# Patient Record
Sex: Female | Born: 1970 | Race: White | Hispanic: No | Marital: Single
Health system: Midwestern US, Community
[De-identification: ages and names within clinical notes are randomized; demographics above are authoritative.]

## PROBLEM LIST (undated history)

## (undated) DIAGNOSIS — M25561 Pain in right knee: Secondary | ICD-10-CM

## (undated) DIAGNOSIS — E079 Disorder of thyroid, unspecified: Secondary | ICD-10-CM

## (undated) DIAGNOSIS — J45909 Unspecified asthma, uncomplicated: Secondary | ICD-10-CM

## (undated) DIAGNOSIS — G8929 Other chronic pain: Secondary | ICD-10-CM

## (undated) DIAGNOSIS — I10 Essential (primary) hypertension: Secondary | ICD-10-CM

## (undated) DIAGNOSIS — F411 Generalized anxiety disorder: Secondary | ICD-10-CM

## (undated) DIAGNOSIS — E038 Other specified hypothyroidism: Secondary | ICD-10-CM

## (undated) DIAGNOSIS — K219 Gastro-esophageal reflux disease without esophagitis: Secondary | ICD-10-CM

## (undated) DIAGNOSIS — M1990 Unspecified osteoarthritis, unspecified site: Secondary | ICD-10-CM

## (undated) DIAGNOSIS — D649 Anemia, unspecified: Secondary | ICD-10-CM

## (undated) DIAGNOSIS — F419 Anxiety disorder, unspecified: Secondary | ICD-10-CM

## (undated) HISTORY — DX: Gastro-esophageal reflux disease without esophagitis: K21.9

## (undated) HISTORY — DX: Anemia, unspecified: D64.9

## (undated) HISTORY — DX: Unspecified asthma, uncomplicated: J45.909

## (undated) HISTORY — PX: TONSILLECTOMY: SUR1361

## (undated) HISTORY — DX: Unspecified osteoarthritis, unspecified site: M19.90

## (undated) HISTORY — PX: HERNIA REPAIR: SHX51

## (undated) HISTORY — DX: Essential (primary) hypertension: I10

## (undated) HISTORY — DX: Disorder of thyroid, unspecified: E07.9

## (undated) HISTORY — DX: Anxiety disorder, unspecified: F41.9

---

## 2004-06-30 HISTORY — PX: BREAST SURGERY: SHX581

## 2017-11-26 DIAGNOSIS — Z01419 Encounter for gynecological examination (general) (routine) without abnormal findings: Secondary | ICD-10-CM | POA: Diagnosis not present

## 2017-11-26 DIAGNOSIS — Z124 Encounter for screening for malignant neoplasm of cervix: Secondary | ICD-10-CM | POA: Diagnosis not present

## 2017-12-02 DIAGNOSIS — J069 Acute upper respiratory infection, unspecified: Secondary | ICD-10-CM | POA: Diagnosis not present

## 2017-12-02 DIAGNOSIS — J028 Acute pharyngitis due to other specified organisms: Secondary | ICD-10-CM | POA: Diagnosis not present

## 2017-12-07 DIAGNOSIS — J069 Acute upper respiratory infection, unspecified: Secondary | ICD-10-CM | POA: Diagnosis not present

## 2017-12-07 DIAGNOSIS — Z1231 Encounter for screening mammogram for malignant neoplasm of breast: Secondary | ICD-10-CM | POA: Diagnosis not present

## 2017-12-16 DIAGNOSIS — E039 Hypothyroidism, unspecified: Secondary | ICD-10-CM | POA: Diagnosis not present

## 2017-12-16 DIAGNOSIS — E559 Vitamin D deficiency, unspecified: Secondary | ICD-10-CM | POA: Diagnosis not present

## 2018-03-05 DIAGNOSIS — H524 Presbyopia: Secondary | ICD-10-CM | POA: Diagnosis not present

## 2018-04-05 DIAGNOSIS — J069 Acute upper respiratory infection, unspecified: Secondary | ICD-10-CM | POA: Diagnosis not present

## 2018-04-15 DIAGNOSIS — R05 Cough: Secondary | ICD-10-CM | POA: Diagnosis not present

## 2018-04-15 DIAGNOSIS — J209 Acute bronchitis, unspecified: Secondary | ICD-10-CM | POA: Diagnosis not present

## 2018-04-22 DIAGNOSIS — J209 Acute bronchitis, unspecified: Secondary | ICD-10-CM | POA: Diagnosis not present

## 2018-05-14 DIAGNOSIS — J209 Acute bronchitis, unspecified: Secondary | ICD-10-CM | POA: Diagnosis not present

## 2018-05-19 DIAGNOSIS — J410 Simple chronic bronchitis: Secondary | ICD-10-CM | POA: Diagnosis not present

## 2018-08-19 NOTE — Telephone Encounter (Signed)
Requested Prescriptions     Pending Prescriptions Disp Refills   ??? losartan (COZAAR) 50 mg tablet 90 Tab 0     Sig: Take 1 Tab by mouth daily.

## 2018-08-20 MED ORDER — LOSARTAN 50 MG TAB
50 mg | ORAL_TABLET | Freq: Every day | ORAL | 0 refills | Status: DC
Start: 2018-08-20 — End: 2018-11-10

## 2018-09-09 MED ORDER — MONTELUKAST 10 MG TAB
10 mg | ORAL_TABLET | ORAL | 0 refills | Status: DC
Start: 2018-09-09 — End: 2018-12-02

## 2018-10-25 ENCOUNTER — Telehealth
Admit: 2018-10-25 | Discharge: 2018-10-25 | Payer: PRIVATE HEALTH INSURANCE | Attending: Internal Medicine | Primary: Internal Medicine

## 2018-10-25 ENCOUNTER — Telehealth: Attending: Internal Medicine | Primary: Internal Medicine

## 2018-10-25 DIAGNOSIS — I10 Essential (primary) hypertension: Secondary | ICD-10-CM

## 2018-10-25 DIAGNOSIS — Z9114 Patient's other noncompliance with medication regimen: Secondary | ICD-10-CM | POA: Diagnosis not present

## 2018-10-25 DIAGNOSIS — J301 Allergic rhinitis due to pollen: Secondary | ICD-10-CM | POA: Diagnosis not present

## 2018-10-25 DIAGNOSIS — R69 Illness, unspecified: Secondary | ICD-10-CM | POA: Diagnosis not present

## 2018-10-25 MED ORDER — ALPRAZOLAM 0.25 MG TAB
0.25 mg | ORAL_TABLET | Freq: Every day | ORAL | 0 refills | Status: AC | PRN
Start: 2018-10-25 — End: 2018-11-24

## 2018-10-25 MED ORDER — SERTRALINE 25 MG TAB
25 mg | ORAL_TABLET | Freq: Every day | ORAL | 0 refills | Status: DC
Start: 2018-10-25 — End: 2019-01-20

## 2018-10-25 NOTE — Progress Notes (Signed)
Progress Note    Patient: Victoria CrawLisa Carmen MRN: [id number]     Date of Birth: Mar 12, 1971  Age: 48 y.o.  Sex: female        Reason for Visit: No chief complaint on file.  The patient has given consent to a virtual visit and understands:    ??? The patient has the right to refuse a virtual visit and have a face-to-face visit.  ??? The patient has the right to know the professional credentials of the person who will be speaking to them.  ??? The patient has the right to know the location of the provider and what technology they will be using.  ??? The patient has the right to have appropriate staff available to them immediately while receiving telehealth services for emergencies or immediate needs.  ??? The patient has the right to know the identities of all persons on the call.  ??? The patient has the right to ask for a different provider and that, if they request a different provider, there may be a delay or a face-to-face visit may be needed.  ??? Telehealth calls cannot be recorded without the patient's consent.  ??? Translation services must be provided if needed.  ??? The patient must be informed that coinsurances and deductibles will be applied by Medicare and they are responsible for these balances.  Patient was read the rights and verbal consent was received  The provider Quentin AngstStacie Patience Nuzzo, MD is located at the office ([address] [place], [place] [id number]) and the patient, Victoria Campbell is located in their car    Subjective:   This is a 48 year old female with past medical history significant for anxiety and hypertension.  The patient stated that she is considering a central plate but her office is currently closed.  She does anticipate that she will be going back to work shortly.  She stated with the coronavirus crisis that she has been under increasing amounts of stress and anxiety.  The patient stated that she has not been taking the Zoloft every day as she is worried about taking too much.  Patient does state  that her anxiety has been worse recently and she like to go back on the medication every day.  She is also requesting a refill of the alprazolam.    Pre-existing problem list  Patient Active Problem List   Diagnosis Code   ??? Generalized anxiety disorder F41.1   ??? Subclinical hypothyroidism E03.9   ??? Essential hypertension I10   ??? Seasonal allergic rhinitis due to pollen J30.1     Allergies   Allergen Reactions   ??? Codeine Itching   ??? Erythromycin Base Diarrhea   ??? Penicillins Itching     Current Outpatient Medications   Medication Sig Dispense Refill   ??? levothyroxine (SYNTHROID) 25 mcg tablet Take  by mouth.     ??? sertraline (ZOLOFT) 25 mg tablet Take 1 Tab by mouth daily for 90 days. 90 Tab 0   ??? ALPRAZolam (XANAX) 0.25 mg tablet Take 1 Tab by mouth daily as needed for Anxiety for up to 30 days. 30 Tab 0   ??? montelukast (SINGULAIR) 10 mg tablet TAKE 1 TABLET BY MOUTH EVERY DAY 90 Tab 0   ??? losartan (COZAAR) 50 mg tablet Take 1 Tab by mouth daily. 90 Tab 0     Objective:     There were no vitals filed for this visit.     Review of Systems   Constitutional: Negative for chills  and fever.   HENT: Negative for congestion.    Respiratory: Negative for cough and shortness of breath.    Cardiovascular: Negative for chest pain, palpitations, orthopnea and claudication.   Gastrointestinal: Negative for diarrhea, nausea and vomiting.   Genitourinary: Negative for dysuria, frequency and urgency.   Musculoskeletal: Negative for back pain.   Neurological: Negative for headaches.   Psychiatric/Behavioral: The patient is nervous/anxious.      Physical Exam:   Virtual Visit  Patient alert, Awake and oriented x 3  No acute distress  Asking and answering questions appropriately  Normal affect    Assessment:     1. Essential hypertension  Keep to low sodium diet and increase exercise and work on weight loss. The patient stated that she has enough losartan    2. Generalized anxiety disorder   Recommended daily exercise to help alleviate some of her anxiety. Also recommended that she take her medications daily as instructed  - sertraline (ZOLOFT) 25 mg tablet; Take 1 Tab by mouth daily for 90 days.  Dispense: 90 Tab; Refill: 0  - ALPRAZolam (XANAX) 0.25 mg tablet; Take 1 Tab by mouth daily as needed for Anxiety for up to 30 days.  Dispense: 30 Tab; Refill: 0    3. Seasonal allergic rhinitis due to pollen  OTC anti-histamines as needed     4. Noncompliance with medication treatment due to underuse of medication  Discussed with the patient in length that she should have run out of her zoloft in 07/2018 if she was taking the medication daily. Patient voiced understanding and stated that she plans to make sure she takes it everyday. I explained to the patient that the benefits will not be there if the medication is not taken daily. The patient voiced understanding.      Follow-up and Dispositions    ?? Return if symptoms worsen or fail to improve.         Signed By: Quentin Angst, MD     October 25, 2018

## 2018-10-25 NOTE — Progress Notes (Signed)
Progress Note    Patient: Victoria CrawLisa Campbell MRN: [id number]     Date of Birth: September 08, 1970  Age: 48 y.o.  Sex: female        Reason for Visit: No chief complaint on file.  The patient has given consent to a virtual visit and understands:    ??? The patient has the right to refuse a virtual visit and have a face-to-face visit.  ??? The patient has the right to know the professional credentials of the person who will be speaking to them.  ??? The patient has the right to know the location of the provider and what technology they will be using.  ??? The patient has the right to have appropriate staff available to them immediately while receiving telehealth services for emergencies or immediate needs.  ??? The patient has the right to know the identities of all persons on the call.  ??? The patient has the right to ask for a different provider and that, if they request a different provider, there may be a delay or a face-to-face visit may be needed.  ??? Telehealth calls cannot be recorded without the patient's consent.  ??? Translation services must be provided if needed.  ??? The patient must be informed that coinsurances and deductibles will be applied by Medicare and they are responsible for these balances.  Patient was read the rights and verbal consent was received  The provider Quentin AngstStacie Morayma Godown, MD is located at the office ([address] [place], [place] [id number]) and the patient, Victoria Campbell is located in their car    Subjective:   This is a 48 year old female with past medical history significant for anxiety and hypertension.  The patient stated that she is considering a central plate but her office is currently closed.  She does anticipate that she will be going back to work shortly.  She stated with the coronavirus crisis that she has been under increasing amounts of stress and anxiety.  The patient stated that she has not been taking the Zoloft every day as she is worried about taking too much.  Patient does state that her anxiety has  been worse recently and she like to go back on the medication every day.  She is also requesting a refill of the alprazolam.    Pre-existing problem list  Patient Active Problem List   Diagnosis Code   ??? Generalized anxiety disorder F41.1   ??? Subclinical hypothyroidism E03.9   ??? Essential hypertension I10   ??? Seasonal allergic rhinitis due to pollen J30.1     Allergies   Allergen Reactions   ??? Codeine Itching   ??? Erythromycin Base Diarrhea   ??? Penicillins Itching     Current Outpatient Medications   Medication Sig Dispense Refill   ??? levothyroxine (SYNTHROID) 25 mcg tablet Take  by mouth.     ??? sertraline (ZOLOFT) 25 mg tablet Take 1 Tab by mouth daily for 90 days. 90 Tab 0   ??? ALPRAZolam (XANAX) 0.25 mg tablet Take 1 Tab by mouth daily as needed for Anxiety for up to 30 days. 30 Tab 0   ??? montelukast (SINGULAIR) 10 mg tablet TAKE 1 TABLET BY MOUTH EVERY DAY 90 Tab 0   ??? losartan (COZAAR) 50 mg tablet Take 1 Tab by mouth daily. 90 Tab 0     Objective:     There were no vitals filed for this visit.     Review of Systems   Constitutional: Negative for chills  and fever.   HENT: Negative for congestion.    Respiratory: Negative for cough and shortness of breath.    Cardiovascular: Negative for chest pain, palpitations, orthopnea and claudication.   Gastrointestinal: Negative for diarrhea, nausea and vomiting.   Genitourinary: Negative for dysuria, frequency and urgency.   Musculoskeletal: Negative for back pain.   Neurological: Negative for headaches.   Psychiatric/Behavioral: The patient is nervous/anxious.      Physical Exam:   Virtual Visit  Patient alert, Awake and oriented x 3  No acute distress  Asking and answering questions appropriately  Normal affect    Assessment:     1. Essential hypertension  Keep to low sodium diet and increase exercise and work on weight loss. The patient stated that she has enough losartan    2. Generalized anxiety disorder  Recommended daily exercise to help alleviate some of her  anxiety. Also recommended that she take her medications daily as instructed  - sertraline (ZOLOFT) 25 mg tablet; Take 1 Tab by mouth daily for 90 days.  Dispense: 90 Tab; Refill: 0  - ALPRAZolam (XANAX) 0.25 mg tablet; Take 1 Tab by mouth daily as needed for Anxiety for up to 30 days.  Dispense: 30 Tab; Refill: 0    3. Seasonal allergic rhinitis due to pollen  OTC anti-histamines as needed     4. Noncompliance with medication treatment due to underuse of medication  Discussed with the patient in length that she should have run out of her zoloft in 07/2018 if she was taking the medication daily. Patient voiced understanding and stated that she plans to make sure she takes it everyday. I explained to the patient that the benefits will not be there if the medication is not taken daily. The patient voiced understanding.      Follow-up and Dispositions    ?? Return if symptoms worsen or fail to improve.         Signed By: Quentin Angst, MD     October 25, 2018

## 2018-11-10 MED ORDER — LOSARTAN 50 MG TAB
50 mg | ORAL_TABLET | ORAL | 0 refills | Status: DC
Start: 2018-11-10 — End: 2019-02-07

## 2018-12-02 MED ORDER — MONTELUKAST 10 MG TAB
10 mg | ORAL_TABLET | ORAL | 0 refills | Status: DC
Start: 2018-12-02 — End: 2019-03-01

## 2019-01-20 ENCOUNTER — Encounter

## 2019-01-20 MED ORDER — SERTRALINE 25 MG TAB
25 mg | ORAL_TABLET | ORAL | 0 refills | Status: DC
Start: 2019-01-20 — End: 2019-04-17

## 2019-02-07 MED ORDER — LOSARTAN 50 MG TAB
50 mg | ORAL_TABLET | ORAL | 0 refills | Status: DC
Start: 2019-02-07 — End: 2019-05-09

## 2019-02-07 NOTE — Telephone Encounter (Signed)
Refill completed.  Please remind the patient that she is due for office visit and fasting labs prior to next refill.

## 2019-02-16 NOTE — Telephone Encounter (Signed)
Left  Message  For  Pt  To  Call   back

## 2019-03-01 MED ORDER — MONTELUKAST 10 MG TAB
10 mg | ORAL_TABLET | ORAL | 0 refills | Status: DC
Start: 2019-03-01 — End: 2019-06-21

## 2019-03-13 DIAGNOSIS — J00 Acute nasopharyngitis [common cold]: Secondary | ICD-10-CM | POA: Diagnosis not present

## 2019-03-16 ENCOUNTER — Telehealth
Admit: 2019-03-16 | Discharge: 2019-03-16 | Payer: PRIVATE HEALTH INSURANCE | Attending: Internal Medicine | Primary: Internal Medicine

## 2019-03-16 ENCOUNTER — Inpatient Hospital Stay: Admit: 2019-03-16 | Payer: PRIVATE HEALTH INSURANCE | Primary: Internal Medicine

## 2019-03-16 ENCOUNTER — Telehealth: Attending: Internal Medicine | Primary: Internal Medicine

## 2019-03-16 DIAGNOSIS — J0100 Acute maxillary sinusitis, unspecified: Secondary | ICD-10-CM

## 2019-03-16 DIAGNOSIS — Z1159 Encounter for screening for other viral diseases: Secondary | ICD-10-CM | POA: Diagnosis not present

## 2019-03-16 DIAGNOSIS — I10 Essential (primary) hypertension: Secondary | ICD-10-CM | POA: Diagnosis not present

## 2019-03-16 DIAGNOSIS — Z20828 Contact with and (suspected) exposure to other viral communicable diseases: Secondary | ICD-10-CM | POA: Diagnosis not present

## 2019-03-16 MED ORDER — AZITHROMYCIN 250 MG TAB
250 mg | ORAL_TABLET | ORAL | 0 refills | Status: AC
Start: 2019-03-16 — End: 2019-03-21

## 2019-03-16 NOTE — Progress Notes (Signed)
Progress Note    Patient: Victoria Campbell MRN: [id number]     Date of Birth: 1970-08-01  Age: 48 y.o.  Sex: female      Reason for Visit: No chief complaint on file.  The patient has given consent to a virtual visit and understands:    ??? The patient has the right to refuse a virtual visit and have a face-to-face visit.  ??? The patient has the right to know the professional credentials of the person who will be speaking to them.  ??? The patient has the right to know the location of the provider and what technology they will be using.  ??? The patient has the right to have appropriate staff available to them immediately while receiving telehealth services for emergencies or immediate needs.  ??? The patient has the right to know the identities of all persons on the call.  ??? The patient has the right to ask for a different provider and that, if they request a different provider, there may be a delay or a face-to-face visit may be needed.  ??? Telehealth calls cannot be recorded without the patient's consent.  ??? Translation services must be provided if needed.  ??? The patient must be informed that coinsurances and deductibles will be applied by Medicare and they are responsible for these balances.  Patient was read the rights and verbal consent was received  The provider Lyanne Co, MD is located at the office ([address] [place] [address]) and the patient, Victoria Campbell is located in the car    Subjective:   This is a pleasant 48 year old female with past medical history significant for anxiety hypothyroidism and hypertension.  The patient stated on Saturday she started not feeling well with cough congestion and facial pressure.  The patient stated that she assumed it was a sinus infection.  She went to urgent care in [place] on Sunday for evaluation although was not tested for COVID.  She was recommended to use over-the-counter antihistamines and nasal spray.  The patient continues to  not feel well and scheduled appointment today for evaluation.  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 Generalized anxiety disorder F41.1   ??? Subclinical hypothyroidism E03.9   ??? Essential hypertension I10   ??? Seasonal allergic rhinitis due to pollen J30.1       Allergies   Allergen Reactions   ??? Codeine Itching   ??? Erythromycin Base Diarrhea   ??? Penicillins Itching       Current Outpatient Medications   Medication Sig Dispense Refill   ??? azithromycin (Zithromax Z-Pak) 250 mg tablet Take one pack as instructed for 5 days 6 Tab 0   ??? montelukast (SINGULAIR) 10 mg tablet TAKE 1 TABLET BY MOUTH EVERY DAY 90 Tab 0   ??? losartan (COZAAR) 50 mg tablet TAKE 1 TABLET BY MOUTH EVERY DAY 90 Tab 0   ??? sertraline (ZOLOFT) 25 mg tablet TAKE 1 TABLET BY MOUTH EVERY DAY 90 Tab 0   ??? levothyroxine (SYNTHROID) 25 mcg tablet Take  by mouth.         Social History     Tobacco Use   ??? Smoking status: Not on file   Substance Use Topics   ??? Alcohol use: Not on file       Objective:  No Acute Distress/Alert, Awake, Oriented x 3  There were no vitals filed for this visit.     Review of Systems   Constitutional: Negative for chills, fever and malaise/fatigue.   HENT: Positive for congestion, sinus pain and sore throat. Negative for tinnitus.    Eyes: Negative for photophobia.   Respiratory: Positive for cough. Negative for shortness of breath and wheezing.    Cardiovascular: Negative for chest pain, palpitations, orthopnea, claudication and leg swelling.   Gastrointestinal: Negative for diarrhea, nausea and vomiting.   Genitourinary: Negative for dysuria, frequency, hematuria and urgency.   Musculoskeletal: Negative for back pain.   Neurological: Negative for tingling, sensory change, weakness and headaches.   Psychiatric/Behavioral: Negative for depression. The patient is not  nervous/anxious.      Physical Exam:   Virtual visit  Patient is alert awake oriented x3  No acute distress  Asking and answering questions appropriately  No cough or use of accessory muscles during the interview  Able to complete full sentences without stopping  Normal affect    Assessment:   1. Acute non-recurrent maxillary sinusitis  I counseled the patient on supportive care and I recommended that she start a Z-Pak empirically.  I also advised her that she should remain out of work until she gets her COVID-19 test back in quarantine.  The patient advised if any shortness of breath develop she is to go to emergency department immediately  - azithromycin (Zithromax Z-Pak) 250 mg tablet; Take one pack as instructed for 5 days  Dispense: 6 Tab; Refill: 0  - NOVEL CORONAVIRUS (COVID-19); Future    2. Exposure to COVID-19 virus  Counseled patient on the importance of good handwashing.  Also recommended disinfecting countertops and doorknobs regularly. The patient advised that the best thing to do is to stay at home.  Also educated the patient that despite being asymptomatic the virus can shed to others and cause an acute illness. The patient voiced understanding.  - NOVEL CORONAVIRUS (COVID-19); Future    3. Essential hypertension  The patient currently started taking Mucinex D.  I recommended that she avoid all phenylephrine/"D" products as they will elevate her blood pressure even more.    Follow-up and Dispositions    ?? Return if symptoms worsen or fail to improve.         Signed By: Quentin AngstStacie Quantavius Humm, MD     March 16, 2019      Note: This report was transcribed using voice recognition software and is thus   prone to syntax and contextual spelling errors. Please do not hesitate to call   me if anything needs clarification or a dictated addendum.

## 2019-03-16 NOTE — Progress Notes (Signed)
Progress Note    Patient: Victoria CrawLisa Mele MRN: [id number]     Date of Birth: [birth year]  Age: 48 y.o.  Sex: female      Reason for Visit: No chief complaint on file.  The patient has given consent to a virtual visit and understands:    ??? The patient has the right to refuse a virtual visit and have a face-to-face visit.  ??? The patient has the right to know the professional credentials of the person who will be speaking to them.  ??? The patient has the right to know the location of the provider and what technology they will be using.  ??? The patient has the right to have appropriate staff available to them immediately while receiving telehealth services for emergencies or immediate needs.  ??? The patient has the right to know the identities of all persons on the call.  ??? The patient has the right to ask for a different provider and that, if they request a different provider, there may be a delay or a face-to-face visit may be needed.  ??? Telehealth calls cannot be recorded without the patient's consent.  ??? Translation services must be provided if needed.  ??? The patient must be informed that coinsurances and deductibles will be applied by Medicare and they are responsible for these balances.  Patient was read the rights and verbal consent was received  The provider Quentin AngstStacie Vernon Ariel, MD is located at the office ([address] [place], [place] [id number]) and the patient, Victoria Campbell is located in the car    Subjective:   This is a pleasant 48 year old female with past medical history significant for anxiety hypothyroidism and hypertension.  The patient stated on Saturday she started not feeling well with cough congestion and facial pressure.  The patient stated that she assumed it was a sinus infection.  She went to urgent care in [place] on Sunday for evaluation although was not tested for COVID.  She was recommended to use over-the-counter antihistamines and nasal spray.  The patient continues to not feel well and scheduled  appointment today for evaluation.  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 Generalized anxiety disorder F41.1   ??? Subclinical hypothyroidism E03.9   ??? Essential hypertension I10   ??? Seasonal allergic rhinitis due to pollen J30.1       Allergies   Allergen Reactions   ??? Codeine Itching   ??? Erythromycin Base Diarrhea   ??? Penicillins Itching       Current Outpatient Medications   Medication Sig Dispense Refill   ??? azithromycin (Zithromax Z-Pak) 250 mg tablet Take one pack as instructed for 5 days 6 Tab 0   ??? montelukast (SINGULAIR) 10 mg tablet TAKE 1 TABLET BY MOUTH EVERY DAY 90 Tab 0   ??? losartan (COZAAR) 50 mg tablet TAKE 1 TABLET BY MOUTH EVERY DAY 90 Tab 0   ??? sertraline (ZOLOFT) 25 mg tablet TAKE 1 TABLET BY MOUTH EVERY DAY 90 Tab 0   ??? levothyroxine (SYNTHROID) 25 mcg tablet Take  by mouth.         Social History     Tobacco Use   ??? Smoking status: Not on file   Substance Use Topics   ??? Alcohol use: Not on file       Objective:  No Acute Distress/Alert, Awake, Oriented x 3  There were no vitals filed for this visit.     Review of Systems   Constitutional: Negative for chills, fever and malaise/fatigue.   HENT: Positive for congestion, sinus pain and sore throat. Negative for tinnitus.    Eyes: Negative for photophobia.   Respiratory: Positive for cough. Negative for shortness of breath and wheezing.    Cardiovascular: Negative for chest pain, palpitations, orthopnea, claudication and leg swelling.   Gastrointestinal: Negative for diarrhea, nausea and vomiting.   Genitourinary: Negative for dysuria, frequency, hematuria and urgency.   Musculoskeletal: Negative for back pain.   Neurological: Negative for tingling, sensory change, weakness and headaches.   Psychiatric/Behavioral: Negative for depression. The patient is not nervous/anxious.      Physical Exam:    Virtual visit  Patient is alert awake oriented x3  No acute distress  Asking and answering questions appropriately  No cough or use of accessory muscles during the interview  Able to complete full sentences without stopping  Normal affect    Assessment:   1. Acute non-recurrent maxillary sinusitis  I counseled the patient on supportive care and I recommended that she start a Z-Pak empirically.  I also advised her that she should remain out of work until she gets her COVID-19 test back in quarantine.  The patient advised if any shortness of breath develop she is to go to emergency department immediately  - azithromycin (Zithromax Z-Pak) 250 mg tablet; Take one pack as instructed for 5 days  Dispense: 6 Tab; Refill: 0  - NOVEL CORONAVIRUS (COVID-19); Future    2. Exposure to COVID-19 virus  Counseled patient on the importance of good handwashing.  Also recommended disinfecting countertops and doorknobs regularly. The patient advised that the best thing to do is to stay at home.  Also educated the patient that despite being asymptomatic the virus can shed to others and cause an acute illness. The patient voiced understanding.  - NOVEL CORONAVIRUS (COVID-19); Future    3. Essential hypertension  The patient currently started taking Mucinex D.  I recommended that she avoid all phenylephrine/"D" products as they will elevate her blood pressure even more.    Follow-up and Dispositions    ?? Return if symptoms worsen or fail to improve.         Signed By: Lyanne Co, MD     March 16, 2019      Note: This report was transcribed using voice recognition software and is thus   prone to syntax and contextual spelling errors. Please do not hesitate to call   me if anything needs clarification or a dictated addendum.

## 2019-03-17 LAB — COVID-19, NP
SARS-CoV-2: NOT DETECTED
SARS-CoV-2: NOT DETECTED

## 2019-03-17 NOTE — Telephone Encounter (Signed)
Dr [facility]  Pt  Called   And  She  Would  Like  To know  If  You can fax  Her out of  Work note  To  [phone]. please

## 2019-03-17 NOTE — Telephone Encounter (Signed)
Victoria Campbell, this note was generated for the patient yesterday and printed out.  Please fax the note that has already been generated.

## 2019-03-19 NOTE — Telephone Encounter (Signed)
Please let the patient know her COVID test returned Negative

## 2019-03-19 NOTE — Telephone Encounter (Signed)
Pt aware and voiced understanding.

## 2019-03-23 DIAGNOSIS — D235 Other benign neoplasm of skin of trunk: Secondary | ICD-10-CM | POA: Diagnosis not present

## 2019-03-23 DIAGNOSIS — L918 Other hypertrophic disorders of the skin: Secondary | ICD-10-CM | POA: Diagnosis not present

## 2019-04-07 DIAGNOSIS — R69 Illness, unspecified: Secondary | ICD-10-CM | POA: Diagnosis not present

## 2019-04-07 DIAGNOSIS — Z01419 Encounter for gynecological examination (general) (routine) without abnormal findings: Secondary | ICD-10-CM | POA: Diagnosis not present

## 2019-04-17 ENCOUNTER — Encounter

## 2019-04-17 MED ORDER — SERTRALINE 25 MG TAB
25 mg | ORAL_TABLET | ORAL | 0 refills | Status: DC
Start: 2019-04-17 — End: 2019-07-12

## 2019-05-05 DIAGNOSIS — Z30433 Encounter for removal and reinsertion of intrauterine contraceptive device: Secondary | ICD-10-CM | POA: Diagnosis not present

## 2019-05-09 ENCOUNTER — Encounter

## 2019-05-09 MED ORDER — LOSARTAN 50 MG TAB
50 mg | ORAL_TABLET | ORAL | 0 refills | Status: DC
Start: 2019-05-09 — End: 2019-08-08

## 2019-05-09 NOTE — Telephone Encounter (Signed)
Medication has been refilled but please let the patient know that she needs a visit and fasting labs prior to next refill

## 2019-05-10 NOTE — Telephone Encounter (Signed)
Dr  Sc  Please  Can I   Have  A  Script  For  Fasting  B/w pt  Works  At  [employer]  Asked  If  We  Can  Fax to  Her [phone]

## 2019-05-11 NOTE — Telephone Encounter (Signed)
Lab slip has been generated for the patient to pick up

## 2019-05-11 NOTE — Addendum Note (Signed)
Addended by: Quentin Angst on: 05/11/2019 09:32 AM     Modules accepted: Orders

## 2019-05-11 NOTE — Addendum Note (Signed)
Addendum  Note by Quentin Angst, MD at 05/11/19 0932                Author: Quentin Angst, MD  Service: --  Author Type: Physician       Filed: 05/11/19 0932  Encounter Date: 05/09/2019  Status: Signed          Editor: Quentin Angst, MD (Physician)          Addended by: Quentin Angst on: 05/11/2019 09:32 AM    Modules accepted: Orders

## 2019-05-23 DIAGNOSIS — Z30431 Encounter for routine checking of intrauterine contraceptive device: Secondary | ICD-10-CM | POA: Diagnosis not present

## 2019-05-23 DIAGNOSIS — N898 Other specified noninflammatory disorders of vagina: Secondary | ICD-10-CM | POA: Diagnosis not present

## 2019-05-26 LAB — METABOLIC PANEL, COMPREHENSIVE
ALB/GLOBRATIO: 1.9 (calc) (ref 1.0–2.5)
ALT (SGPT): 13 U/L (ref 6–29)
AST (SGOT): 12 U/L (ref 10–35)
Albumin: 4.7 g/dL (ref 3.6–5.1)
Alkaline Phosphatase, total: 67 U/L (ref 31–125)
BUN: 10 mg/dL (ref 7–25)
Bilirubin, total: 0.9 mg/dL (ref 0.2–1.2)
CO2: 29 mmol/L (ref 20–32)
Calcium: 9.7 mg/dL (ref 8.6–10.2)
Chloride: 104 mmol/L (ref 98–110)
Creatinine: 0.73 mg/dL (ref 0.50–1.10)
GFR est AA: 114 mL/min/{1.73_m2} (ref >=60)
GFR est non-AA: 99 mL/min/{1.73_m2} (ref >=60)
Globulin: 2.5 g/dL (calc) (ref 1.9–3.7)
Glucose: 85 mg/dL (ref 65–99)
Potassium: 3.9 mmol/L (ref 3.5–5.3)
Protein, total: 7.2 g/dL (ref 6.1–8.1)
Sodium: 140 mmol/L (ref 135–146)

## 2019-05-26 LAB — CBC WITH AUTOMATED DIFF
ABS. BASOPHILS: 19 cells/uL (ref 0–200)
ABS. EOSINOPHILS: 47 cells/uL (ref 15–500)
ABS. LYMPHOCYTES: 2539 cells/uL (ref 850–3900)
ABS. MONOCYTES: 595 cells/uL (ref 200–950)
ABS. NEUTROPHILS: 6101 cells/uL (ref 1500–7800)
BASOPHILS: 0.2 % (ref 0–2)
EOSINOPHILS: 0.5 % (ref 0–8)
HCT: 39.7 % (ref 35.0–45.0)
HGB: 13.2 g/dL (ref 11.7–15.5)
LYMPHOCYTES: 27.3 % (ref 15–49)
MCH: 29.6 pg (ref 27.0–33.0)
MCHC: 33.2 g/dL (ref 32.0–36.0)
MCV: 89 fL (ref 80.0–100.0)
MEAN PLATELET VOLUME: 10.7 fL (ref 7.5–12.5)
MONOCYTES: 6.4 % (ref 0–13)
Neutrophils: 65.6 % (ref 38–80)
PLATELET: 318 10*3/uL (ref 140–400)
RBC: 4.46 10*6/uL (ref 3.80–5.10)
RDW: 13 % (ref 11.0–15.0)
WBC: 9.3 10*3/uL (ref 3.8–10.8)

## 2019-05-26 LAB — LIPID PANEL
Chol/HDL Ratio: 3.7 (calc) (ref <5.0)
Cholesterol, Total: 220 mg/dL — ABNORMAL HIGH (ref <200)
Cholesterol, total: 220 mg/dL — ABNORMAL HIGH (ref <200)
Cholesterol/HDL ratio: 3.7 (calc) (ref <5.0)
HDL Cholesterol: 59 mg/dL (ref >=50)
HDL: 59 mg/dL (ref >=50)
LDL Cholesterol: 138 mg/dL (calc) — ABNORMAL HIGH
LDL-CHOLESTEROL: 138 mg/dL (calc) — ABNORMAL HIGH
Non-HDL Cholesterol: 161 mg/dL (calc) — ABNORMAL HIGH (ref <130)
Non-HDL Cholesterol: 161 mg/dL (calc) — ABNORMAL HIGH (ref <130)
Triglyceride: 109 mg/dL (ref <150)
Triglycerides: 109 mg/dL (ref <150)

## 2019-05-26 LAB — TSH 3RD GENERATION
TSH: 1.71 mIU/L
TSH: 1.71 mIU/L

## 2019-05-26 LAB — COMPREHENSIVE METABOLIC PANEL
ALT: 13 U/L (ref 6–29)
AST: 12 U/L (ref 10–35)
Albumin/Globulin Ratio: 1.9 (calc) (ref 1.0–2.5)
Albumin: 4.7 g/dL (ref 3.6–5.1)
Alkaline Phosphatase: 67 U/L (ref 31–125)
BUN: 10 mg/dL (ref 7–25)
CO2: 29 mmol/L (ref 20–32)
Calcium: 9.7 mg/dL (ref 8.6–10.2)
Chloride: 104 mmol/L (ref 98–110)
Creatinine: 0.73 mg/dL (ref 0.50–1.10)
EGFR IF NonAfrican American: 99 mL/min/{1.73_m2} (ref >=60)
GFR African American: 114 mL/min/{1.73_m2} (ref >=60)
Globulin: 2.5 g/dL (calc) (ref 1.9–3.7)
Glucose: 85 mg/dL (ref 65–99)
Potassium: 3.9 mmol/L (ref 3.5–5.3)
Sodium: 140 mmol/L (ref 135–146)
Total Bilirubin: 0.9 mg/dL (ref 0.2–1.2)
Total Protein: 7.2 g/dL (ref 6.1–8.1)

## 2019-05-26 LAB — CBC WITH AUTO DIFFERENTIAL
Basophils %: 0.2 % (ref 0–2)
Basophils Absolute: 19 cells/uL (ref 0–200)
Eosinophils %: 0.5 % (ref 0–8)
Eosinophils Absolute: 47 cells/uL (ref 15–500)
Hematocrit: 39.7 % (ref 35.0–45.0)
Hemoglobin: 13.2 g/dL (ref 11.7–15.5)
Lymphocytes %: 27.3 % (ref 15–49)
Lymphocytes Absolute: 2539 cells/uL (ref 850–3900)
MCH: 29.6 pg (ref 27.0–33.0)
MCHC: 33.2 g/dL (ref 32.0–36.0)
MCV: 89 fL (ref 80.0–100.0)
MPV: 10.7 fL (ref 7.5–12.5)
Monocytes %: 6.4 % (ref 0–13)
Monocytes Absolute: 595 cells/uL (ref 200–950)
Neutrophils %: 65.6 % (ref 38–80)
Neutrophils Absolute: 6101 cells/uL (ref 1500–7800)
Platelets: 318 10*3/uL (ref 140–400)
RBC: 4.46 10*6/uL (ref 3.80–5.10)
RDW: 13 % (ref 11.0–15.0)
WBC: 9.3 10*3/uL (ref 3.8–10.8)

## 2019-05-27 NOTE — Telephone Encounter (Signed)
Total and LDL cholesterol levels are elevated.  Please ask the patient to try to keep to low-fat low-cholesterol diet and increase exercise.  Would like to repeat again in 6 months.

## 2019-05-30 NOTE — Telephone Encounter (Signed)
Message left to call.

## 2019-05-30 NOTE — Telephone Encounter (Signed)
Patient aware and voiced understanding.

## 2019-06-09 DIAGNOSIS — Z1231 Encounter for screening mammogram for malignant neoplasm of breast: Secondary | ICD-10-CM | POA: Diagnosis not present

## 2019-06-21 MED ORDER — MONTELUKAST 10 MG TAB
10 mg | ORAL_TABLET | ORAL | 0 refills | Status: DC
Start: 2019-06-21 — End: 2019-09-19

## 2019-06-22 ENCOUNTER — Telehealth
Admit: 2019-06-22 | Discharge: 2019-06-22 | Payer: PRIVATE HEALTH INSURANCE | Attending: Internal Medicine | Primary: Internal Medicine

## 2019-06-22 ENCOUNTER — Inpatient Hospital Stay: Admit: 2019-06-22 | Payer: PRIVATE HEALTH INSURANCE | Attending: Internal Medicine | Primary: Internal Medicine

## 2019-06-22 ENCOUNTER — Telehealth: Attending: Internal Medicine | Primary: Internal Medicine

## 2019-06-22 DIAGNOSIS — K219 Gastro-esophageal reflux disease without esophagitis: Secondary | ICD-10-CM

## 2019-06-22 DIAGNOSIS — Z20828 Contact with and (suspected) exposure to other viral communicable diseases: Secondary | ICD-10-CM | POA: Diagnosis not present

## 2019-06-22 DIAGNOSIS — Z1159 Encounter for screening for other viral diseases: Secondary | ICD-10-CM | POA: Diagnosis not present

## 2019-06-22 NOTE — Telephone Encounter (Signed)
Pt aware and note has been faxed to employer

## 2019-06-22 NOTE — Telephone Encounter (Signed)
Note has been completed for the patient to have someone pick up or fax to her employer

## 2019-06-22 NOTE — Telephone Encounter (Signed)
Pt called and would like a note to stay out of work until she get her covid test results. Pt did a telemed appt this morning.

## 2019-06-22 NOTE — Progress Notes (Signed)
Progress Note    Patient: Victoria Campbell MRN: [id number]     Date of Birth: 21-Dec-1970  Age: 48 y.o.  Sex: female      Reason for Visit: No chief complaint on file.  The patient has given consent to a virtual visit and understands:    ??? The patient has the right to refuse a virtual visit and have a face-to-face visit.  ??? The patient has the right to know the professional credentials of the person who will be speaking to them.  ??? The patient has the right to know the location of the provider and what technology they will be using.  ??? The patient has the right to have appropriate staff available to them immediately while receiving telehealth services for emergencies or immediate needs.  ??? The patient has the right to know the identities of all persons on the call.  ??? The patient has the right to ask for a different provider and that, if they request a different provider, there may be a delay or a face-to-face visit may be needed.  ??? Telehealth calls cannot be recorded without the patient's consent.  ??? Translation services must be provided if needed.  ??? The patient must be informed that coinsurances and deductibles will be applied by Medicare and they are responsible for these balances.  Patient was read the rights and verbal consent was received  The provider Quentin Angst, MD is located at the office ([address] 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  The patient states that she does not any fevers had a headache few days ago but no congestion.  She noted that she has been having an increased amount of heartburn and just started on Nexium yesterday.  The patient denies any cough or shortness of breath.    Pre-existing problem list  Patient Active Problem List   Diagnosis Code   ??? Generalized anxiety disorder F41.1   ??? Subclinical hypothyroidism E03.9    ??? Essential hypertension I10   ??? Seasonal allergic rhinitis due to pollen J30.1   ??? Gastroesophageal reflux disease without esophagitis K21.9       Allergies   Allergen Reactions   ??? Codeine Itching   ??? Erythromycin Base Diarrhea   ??? Penicillins Itching       Current Outpatient Medications   Medication Sig Dispense Refill   ??? montelukast (SINGULAIR) 10 mg tablet TAKE 1 TABLET BY MOUTH EVERY DAY 90 Tab 0   ??? losartan (COZAAR) 50 mg tablet TAKE 1 TABLET BY MOUTH EVERY DAY 90 Tab 0   ??? sertraline (ZOLOFT) 25 mg tablet TAKE 1 TABLET BY MOUTH EVERY DAY 90 Tab 0   ??? levothyroxine (SYNTHROID) 25 mcg tablet Take  by mouth.       Social History     Tobacco Use   ??? Smoking status: Not on file   Substance Use Topics   ??? Alcohol use: Not on file       Objective:   No Acute Distress/Alert, Awake, Oriented x 3  There were no vitals filed for this visit.     Review of Systems   Constitutional: Negative for chills, fever and malaise/fatigue.   HENT: Negative for congestion, sore throat and tinnitus.    Eyes: Negative for double vision.   Respiratory:  Negative for cough and shortness of breath.    Cardiovascular: Negative for chest pain, palpitations, orthopnea, claudication and leg swelling.   Gastrointestinal: Negative for diarrhea, nausea and vomiting.   Genitourinary: Negative for dysuria, frequency, hematuria and urgency.   Musculoskeletal: Negative for back pain and myalgias.   Skin: Negative for rash.   Neurological: Negative for tingling, sensory change, speech change, weakness and headaches.   Psychiatric/Behavioral: Negative for depression and hallucinations.     Physical Exam:   Virtual visit  Patient is alert awake oriented x3  No acute distress  Asking answering questions appropriately  No cough or use of accessory muscles to breathe  Able to complete full sentences without stopping  Normal affect    Assessment:   1. Gastroesophageal reflux disease without esophagitis   Advised the patient that she can use the Nexium daily for the next 10 days but I do not recommend using it long-term.  I also counseled the patient on keeping to a diet that would avoid exacerbation of the reflux including but not limited to acidic foods red sauces and caffeine.    2. Exposure to COVID-19 virus  Counseled patient on the importance of good handwashing.  Also recommended disinfecting countertops and doorknobs regularly. The patient advised that the best thing to do is to stay at home.  Also educated the patient that the virus can shed to others and cause an acute illness.  Patient is aware that this is unlikely COVID-19 but due to the nature of her work it is imperative that she is checked.  I recommend that the patient remain out of work until her test results returned.  The patient voiced understanding.  - NOVEL CORONAVIRUS (COVID-19)      Follow-up and Dispositions    ?? Return if symptoms worsen or fail to improve.         Signed By: Lyanne Co, MD     June 22, 2019      Note: This report was transcribed using voice recognition software and is thus   prone to syntax and contextual spelling errors. Please do not hesitate to call   me if anything needs clarification or a dictated addendum.

## 2019-06-22 NOTE — Progress Notes (Signed)
Progress Note    Patient: Victoria Campbell MRN: [id number]     Date of Birth: 1970-09-30  Age: 48 y.o.  Sex: female      Reason for Visit: No chief complaint on file.  The patient has given consent to a virtual visit and understands:    ??? The patient has the right to refuse a virtual visit and have a face-to-face visit.  ??? The patient has the right to know the professional credentials of the person who will be speaking to them.  ??? The patient has the right to know the location of the provider and what technology they will be using.  ??? The patient has the right to have appropriate staff available to them immediately while receiving telehealth services for emergencies or immediate needs.  ??? The patient has the right to know the identities of all persons on the call.  ??? The patient has the right to ask for a different provider and that, if they request a different provider, there may be a delay or a face-to-face visit may be needed.  ??? Telehealth calls cannot be recorded without the patient's consent.  ??? Translation services must be provided if needed.  ??? The patient must be informed that coinsurances and deductibles will be applied by Medicare and they are responsible for these balances.  Patient was read the rights and verbal consent was received  The provider Quentin Angst, MD is located at the office ([address] 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  The patient states that she does not any fevers had a headache few days ago but no congestion.  She noted that she has been having an increased amount of heartburn and just started on Nexium yesterday.  The patient denies any cough or shortness of breath.    Pre-existing problem list  Patient Active Problem List   Diagnosis Code   ??? Generalized anxiety disorder F41.1   ??? Subclinical hypothyroidism E03.9   ???  Essential hypertension I10   ??? Seasonal allergic rhinitis due to pollen J30.1   ??? Gastroesophageal reflux disease without esophagitis K21.9       Allergies   Allergen Reactions   ??? Codeine Itching   ??? Erythromycin Base Diarrhea   ??? Penicillins Itching       Current Outpatient Medications   Medication Sig Dispense Refill   ??? montelukast (SINGULAIR) 10 mg tablet TAKE 1 TABLET BY MOUTH EVERY DAY 90 Tab 0   ??? losartan (COZAAR) 50 mg tablet TAKE 1 TABLET BY MOUTH EVERY DAY 90 Tab 0   ??? sertraline (ZOLOFT) 25 mg tablet TAKE 1 TABLET BY MOUTH EVERY DAY 90 Tab 0   ??? levothyroxine (SYNTHROID) 25 mcg tablet Take  by mouth.       Social History     Tobacco Use   ??? Smoking status: Not on file   Substance Use Topics   ??? Alcohol use: Not on file       Objective:   No Acute Distress/Alert, Awake, Oriented x 3  There were no vitals filed for this visit.     Review of Systems   Constitutional: Negative for chills, fever and malaise/fatigue.   HENT: Negative for congestion, sore throat and tinnitus.    Eyes: Negative for double vision.   Respiratory:  Negative for cough and shortness of breath.    Cardiovascular: Negative for chest pain, palpitations, orthopnea, claudication and leg swelling.   Gastrointestinal: Negative for diarrhea, nausea and vomiting.   Genitourinary: Negative for dysuria, frequency, hematuria and urgency.   Musculoskeletal: Negative for back pain and myalgias.   Skin: Negative for rash.   Neurological: Negative for tingling, sensory change, speech change, weakness and headaches.   Psychiatric/Behavioral: Negative for depression and hallucinations.     Physical Exam:   Virtual visit  Patient is alert awake oriented x3  No acute distress  Asking answering questions appropriately  No cough or use of accessory muscles to breathe  Able to complete full sentences without stopping  Normal affect    Assessment:   1. Gastroesophageal reflux disease without esophagitis  Advised the patient that she can use the Nexium daily  for the next 10 days but I do not recommend using it long-term.  I also counseled the patient on keeping to a diet that would avoid exacerbation of the reflux including but not limited to acidic foods red sauces and caffeine.    2. Exposure to COVID-19 virus  Counseled patient on the importance of good handwashing.  Also recommended disinfecting countertops and doorknobs regularly. The patient advised that the best thing to do is to stay at home.  Also educated the patient that the virus can shed to others and cause an acute illness.  Patient is aware that this is unlikely COVID-19 but due to the nature of her work it is imperative that she is checked.  I recommend that the patient remain out of work until her test results returned.  The patient voiced understanding.  - NOVEL CORONAVIRUS (COVID-19)      Follow-up and Dispositions    ?? Return if symptoms worsen or fail to improve.         Signed By: Lyanne Co, MD     June 22, 2019      Note: This report was transcribed using voice recognition software and is thus   prone to syntax and contextual spelling errors. Please do not hesitate to call   me if anything needs clarification or a dictated addendum.

## 2019-06-24 LAB — COVID-19, NP
SARS-CoV-2: NOT DETECTED
SARS-CoV-2: NOT DETECTED

## 2019-07-08 DIAGNOSIS — R922 Inconclusive mammogram: Secondary | ICD-10-CM | POA: Diagnosis not present

## 2019-07-08 DIAGNOSIS — R928 Other abnormal and inconclusive findings on diagnostic imaging of breast: Secondary | ICD-10-CM | POA: Diagnosis not present

## 2019-07-12 ENCOUNTER — Encounter

## 2019-07-12 MED ORDER — SERTRALINE 25 MG TAB
25 mg | ORAL_TABLET | ORAL | 0 refills | Status: DC
Start: 2019-07-12 — End: 2019-07-19

## 2019-07-19 ENCOUNTER — Telehealth
Admit: 2019-07-19 | Discharge: 2019-07-19 | Payer: PRIVATE HEALTH INSURANCE | Attending: Internal Medicine | Primary: Internal Medicine

## 2019-07-19 ENCOUNTER — Telehealth: Attending: Internal Medicine | Primary: Internal Medicine

## 2019-07-19 DIAGNOSIS — K219 Gastro-esophageal reflux disease without esophagitis: Secondary | ICD-10-CM

## 2019-07-19 DIAGNOSIS — I10 Essential (primary) hypertension: Secondary | ICD-10-CM | POA: Diagnosis not present

## 2019-07-19 DIAGNOSIS — R69 Illness, unspecified: Secondary | ICD-10-CM | POA: Diagnosis not present

## 2019-07-19 MED ORDER — SERTRALINE 50 MG TAB
50 mg | ORAL_TABLET | Freq: Every day | ORAL | 0 refills | Status: DC
Start: 2019-07-19 — End: 2019-10-10

## 2019-07-19 MED ORDER — FAMOTIDINE 20 MG TAB
20 mg | ORAL_TABLET | Freq: Two times a day (BID) | ORAL | 0 refills | Status: DC
Start: 2019-07-19 — End: 2019-10-10

## 2019-07-19 NOTE — Progress Notes (Signed)
Progress Note    Patient: Victoria Campbell MRN: [id number]     Date of Birth: January 29, 1971  Age: 49 y.o.  Sex: female      Reason for Visit: No chief complaint on file.  The patient has given consent to a virtual visit and understands:    ??? The patient has the right to refuse a virtual visit and have a face-to-face visit.  ??? The patient has the right to know the professional credentials of the person who will be speaking to them.  ??? The patient has the right to know the location of the provider and what technology they will be using.  ??? The patient has the right to have appropriate staff available to them immediately while receiving telehealth services for emergencies or immediate needs.  ??? The patient has the right to know the identities of all persons on the call.  ??? The patient has the right to ask for a different provider and that, if they request a different provider, there may be a delay or a face-to-face visit may be needed.  ??? Telehealth calls cannot be recorded without the patient's consent.  ??? Translation services must be provided if needed.  ??? The patient must be informed that coinsurances and deductibles will be applied by Medicare and they are responsible for these balances.  Patient was read the rights and verbal consent was received  The provider Quentin Angst, MD is located at the office ([address] [place], [place] [address]) and the patient, Victoria Campbell  is located at their home    Subjective:   This is a pleasant 49 year old female with past medical history significant for anxiety, hypothyroidism and hypertension.  The patient stated that her reflux has been acting up and she has been taking Nexium which seems to help.  The patient had an appointment scheduled with a gastroenterologist but it was canceled due to COVID-19.  Patient also stated that due to the pandemic her stress level has increased and she thinks that it would be a good idea to increase the dose of her Zoloft daily.     Pre-existing problem list  Patient Active Problem List   Diagnosis Code   ??? Generalized anxiety disorder F41.1   ??? Subclinical hypothyroidism E03.9   ??? Essential hypertension I10   ??? Seasonal allergic rhinitis due to pollen J30.1   ??? Gastroesophageal reflux disease without esophagitis K21.9       Allergies   Allergen Reactions   ??? Codeine Itching   ??? Erythromycin Base Diarrhea   ??? Penicillins Itching       Current Outpatient Medications   Medication Sig Dispense Refill   ??? famotidine (Pepcid) 20 mg tablet Take 1 Tab by mouth two (2) times a day. 180 Tab 0   ??? sertraline (ZOLOFT) 50 mg tablet Take 1 Tab by mouth daily. 90 Tab 0   ??? montelukast (SINGULAIR) 10 mg tablet TAKE 1 TABLET BY MOUTH EVERY DAY 90 Tab 0   ??? losartan (COZAAR) 50 mg tablet TAKE 1 TABLET BY MOUTH EVERY DAY 90 Tab 0   ??? levothyroxine (SYNTHROID) 25 mcg tablet Take  by mouth.       Social History     Tobacco Use   ??? Smoking status: Not on file   Substance Use Topics   ??? Alcohol use: Not on file       Objective:   No Acute Distress/Alert, Awake, Oriented x 3  There were no vitals filed  for this visit.     Review of Systems   Constitutional: Negative for chills, fever and malaise/fatigue.   HENT: Negative for congestion, sore throat and tinnitus.    Eyes: Negative for double vision.   Respiratory: Negative for cough and shortness of breath.    Cardiovascular: Negative for chest pain, palpitations, orthopnea, claudication and leg swelling.   Gastrointestinal: Positive for heartburn. Negative for abdominal pain, diarrhea, nausea and vomiting.   Genitourinary: Negative for dysuria, frequency, hematuria and urgency.   Musculoskeletal: Negative for back pain and myalgias.   Skin: Negative for rash.   Neurological: Negative for tingling, sensory change, speech change, weakness and headaches.   Psychiatric/Behavioral: Negative for depression. The patient is nervous/anxious.      Physical Exam:   Virtual visit  Patient is alert awake oriented x3   No acute distress  Asking answering questions appropriately  No cough or use of accessory muscles to breathe  Able to complete full sentences without stopping  Normal affect  Walking around no ataxia noted    Assessment:   1. Gastroesophageal reflux disease without esophagitis  I had a long discussion with the patient regarding the difference between H2 blockers and PPIs.  If we are going to be anticipating long-term treatment I would like her to be on H2 blocker.  She has been instructed to discontinue the Nexium and start Pepcid 20 mg twice daily.  The patient has also been recommended to follow-up with Dr. Lissa Merlin and reschedule her appointment for reevaluation.  - REFERRAL TO GASTROENTEROLOGY    2. Essential hypertension  Counseled the patient on the importance of continuing her medication and keeping to a low-sodium diet.  I recommended increasing exercise and weight loss.    3. Generalized anxiety disorder  We will increase the patient's dose to 50 mg daily and follow-up in a month's time to see how she is doing.  Patient is in agreement with the plan and voiced understanding  - sertraline (ZOLOFT) 50 mg tablet; Take 1 Tab by mouth daily.  Dispense: 90 Tab; Refill: 0    Follow-up and Dispositions    ?? Return in about 4 weeks (around 08/16/2019) for follow up.         Signed By: Lyanne Co, MD     July 19, 2019      Note: This report was transcribed using voice recognition software and is thus   prone to syntax and contextual spelling errors. Please do not hesitate to call   me if anything needs clarification or a dictated addendum.

## 2019-07-19 NOTE — Progress Notes (Signed)
Progress Note    Patient: Victoria Campbell MRN: [id number]     Date of Birth: 1971/06/08  Age: 49 y.o.  Sex: female      Reason for Visit: No chief complaint on file.  The patient has given consent to a virtual visit and understands:    ??? The patient has the right to refuse a virtual visit and have a face-to-face visit.  ??? The patient has the right to know the professional credentials of the person who will be speaking to them.  ??? The patient has the right to know the location of the provider and what technology they will be using.  ??? The patient has the right to have appropriate staff available to them immediately while receiving telehealth services for emergencies or immediate needs.  ??? The patient has the right to know the identities of all persons on the call.  ??? The patient has the right to ask for a different provider and that, if they request a different provider, there may be a delay or a face-to-face visit may be needed.  ??? Telehealth calls cannot be recorded without the patient's consent.  ??? Translation services must be provided if needed.  ??? The patient must be informed that coinsurances and deductibles will be applied by Medicare and they are responsible for these balances.  Patient was read the rights and verbal consent was received  The provider Quentin Angst, MD is located at the office ([address] [place], [place] [address]) and the patient, Victoria Campbell  is located at their home    Subjective:   This is a pleasant 49 year old female with past medical history significant for anxiety, hypothyroidism and hypertension.  The patient stated that her reflux has been acting up and she has been taking Nexium which seems to help.  The patient had an appointment scheduled with a gastroenterologist but it was canceled due to COVID-19.  Patient also stated that due to the pandemic her stress level has increased and she thinks that it would be a good idea to increase the dose of her Zoloft daily.    Pre-existing problem  list  Patient Active Problem List   Diagnosis Code   ??? Generalized anxiety disorder F41.1   ??? Subclinical hypothyroidism E03.9   ??? Essential hypertension I10   ??? Seasonal allergic rhinitis due to pollen J30.1   ??? Gastroesophageal reflux disease without esophagitis K21.9       Allergies   Allergen Reactions   ??? Codeine Itching   ??? Erythromycin Base Diarrhea   ??? Penicillins Itching       Current Outpatient Medications   Medication Sig Dispense Refill   ??? famotidine (Pepcid) 20 mg tablet Take 1 Tab by mouth two (2) times a day. 180 Tab 0   ??? sertraline (ZOLOFT) 50 mg tablet Take 1 Tab by mouth daily. 90 Tab 0   ??? montelukast (SINGULAIR) 10 mg tablet TAKE 1 TABLET BY MOUTH EVERY DAY 90 Tab 0   ??? losartan (COZAAR) 50 mg tablet TAKE 1 TABLET BY MOUTH EVERY DAY 90 Tab 0   ??? levothyroxine (SYNTHROID) 25 mcg tablet Take  by mouth.       Social History     Tobacco Use   ??? Smoking status: Not on file   Substance Use Topics   ??? Alcohol use: Not on file       Objective:   No Acute Distress/Alert, Awake, Oriented x 3  There were no vitals filed  for this visit.     Review of Systems   Constitutional: Negative for chills, fever and malaise/fatigue.   HENT: Negative for congestion, sore throat and tinnitus.    Eyes: Negative for double vision.   Respiratory: Negative for cough and shortness of breath.    Cardiovascular: Negative for chest pain, palpitations, orthopnea, claudication and leg swelling.   Gastrointestinal: Positive for heartburn. Negative for abdominal pain, diarrhea, nausea and vomiting.   Genitourinary: Negative for dysuria, frequency, hematuria and urgency.   Musculoskeletal: Negative for back pain and myalgias.   Skin: Negative for rash.   Neurological: Negative for tingling, sensory change, speech change, weakness and headaches.   Psychiatric/Behavioral: Negative for depression. The patient is nervous/anxious.      Physical Exam:   Virtual visit  Patient is alert awake oriented x3  No acute distress  Asking  answering questions appropriately  No cough or use of accessory muscles to breathe  Able to complete full sentences without stopping  Normal affect  Walking around no ataxia noted    Assessment:   1. Gastroesophageal reflux disease without esophagitis  I had a long discussion with the patient regarding the difference between H2 blockers and PPIs.  If we are going to be anticipating long-term treatment I would like her to be on H2 blocker.  She has been instructed to discontinue the Nexium and start Pepcid 20 mg twice daily.  The patient has also been recommended to follow-up with Dr. Lissa Merlin and reschedule her appointment for reevaluation.  - REFERRAL TO GASTROENTEROLOGY    2. Essential hypertension  Counseled the patient on the importance of continuing her medication and keeping to a low-sodium diet.  I recommended increasing exercise and weight loss.    3. Generalized anxiety disorder  We will increase the patient's dose to 50 mg daily and follow-up in a month's time to see how she is doing.  Patient is in agreement with the plan and voiced understanding  - sertraline (ZOLOFT) 50 mg tablet; Take 1 Tab by mouth daily.  Dispense: 90 Tab; Refill: 0    Follow-up and Dispositions    ?? Return in about 4 weeks (around 08/16/2019) for follow up.         Signed By: Lyanne Co, MD     July 19, 2019      Note: This report was transcribed using voice recognition software and is thus   prone to syntax and contextual spelling errors. Please do not hesitate to call   me if anything needs clarification or a dictated addendum.

## 2019-08-08 MED ORDER — LOSARTAN 50 MG TAB
50 mg | ORAL_TABLET | ORAL | 0 refills | Status: DC
Start: 2019-08-08 — End: 2019-12-05

## 2019-08-19 ENCOUNTER — Telehealth
Admit: 2019-08-19 | Discharge: 2019-08-19 | Payer: PRIVATE HEALTH INSURANCE | Attending: Internal Medicine | Primary: Internal Medicine

## 2019-08-19 ENCOUNTER — Telehealth: Attending: Internal Medicine | Primary: Internal Medicine

## 2019-08-19 DIAGNOSIS — K219 Gastro-esophageal reflux disease without esophagitis: Secondary | ICD-10-CM

## 2019-08-19 DIAGNOSIS — R69 Illness, unspecified: Secondary | ICD-10-CM | POA: Diagnosis not present

## 2019-08-19 DIAGNOSIS — I10 Essential (primary) hypertension: Secondary | ICD-10-CM | POA: Diagnosis not present

## 2019-08-19 MED ORDER — ALPRAZOLAM 0.25 MG TAB
0.25 mg | ORAL_TABLET | Freq: Every day | ORAL | 0 refills | Status: DC | PRN
Start: 2019-08-19 — End: 2019-12-07

## 2019-08-19 NOTE — Progress Notes (Signed)
Progress Note    Patient: Victoria Campbell MRN: [id number]     Date of Birth: [birth year]  Age: 49 y.o.  Sex: female      Reason for Visit: No chief complaint on file.  The patient has given consent to a virtual visit and understands:    ??? The patient has the right to refuse a virtual visit and have a face-to-face visit.  ??? The patient has the right to know the professional credentials of the person who will be speaking to them.  ??? The patient has the right to know the location of the provider and what technology they will be using.  ??? The patient has the right to have appropriate staff available to them immediately while receiving telehealth services for emergencies or immediate needs.  ??? The patient has the right to know the identities of all persons on the call.  ??? The patient has the right to ask for a different provider and that, if they request a different provider, there may be a delay or a face-to-face visit may be needed.  ??? Telehealth calls cannot be recorded without the patient's consent.  ??? Translation services must be provided if needed.  ??? The patient must be informed that coinsurances and deductibles will be applied by Medicare and they are responsible for these balances.  Patient was read the rights and verbal consent was received  The provider Quentin Angst, MD is located at the office ([address] [place], [place] [address]) and the patient, Victoria Campbell  is located work  Subjective:    This is a very pleasant 49 year old female with past medical history significant for gastroesophageal reflux disease, anxiety and hypertension.  The patient is currently at work.  She states that she is under increasing amounts of stress although the increased dose of the Zoloft has helped.  She is requesting a refill of the alprazolam.  She stated that in January went for her mammogram and she had some additional imaging done and then they requested that she have a short-term follow-up in 3 months.  She stated that she thinks about that regularly and it has made her extra nervous.  The patient also stated that she is doing well on the famotidine although she forgets to take her second dose.    Pre-existing problem list  Patient Active Problem List   Diagnosis Code   ??? Generalized anxiety disorder F41.1   ??? Subclinical hypothyroidism E03.9   ??? Essential hypertension I10   ??? Seasonal allergic rhinitis due to pollen J30.1   ??? Gastroesophageal reflux disease without esophagitis K21.9       Allergies   Allergen Reactions   ??? Codeine Itching   ??? Erythromycin Base Diarrhea   ??? Penicillins Itching       Current Outpatient Medications   Medication Sig Dispense Refill   ??? ALPRAZolam (XANAX) 0.25 mg tablet Take 1 Tab by mouth daily as needed for Anxiety. 30 Tab 0   ??? losartan (COZAAR) 50 mg tablet TAKE 1 TABLET BY MOUTH EVERY DAY 90 Tab 0   ??? famotidine (Pepcid) 20 mg tablet Take 1 Tab by mouth two (2) times a day. 180 Tab 0   ??? sertraline (ZOLOFT) 50 mg tablet Take 1 Tab by mouth daily. 90 Tab 0   ??? montelukast (SINGULAIR) 10 mg tablet TAKE 1 TABLET BY MOUTH EVERY DAY 90 Tab 0   ??? levothyroxine (SYNTHROID) 25 mcg tablet Take  by mouth.  Social History     Tobacco Use   ??? Smoking status: Not on file   Substance Use Topics   ??? Alcohol use: Not on file       Objective:   No Acute Distress/Alert, Awake, Oriented x 3  Blood pressures checked at work was 137/92 and 132/82    Review of Systems    Constitutional: Negative for chills, fever and malaise/fatigue.   HENT: Negative for congestion, sore throat and tinnitus.    Eyes: Negative for blurred vision.   Respiratory: Negative for cough and shortness of breath.    Cardiovascular: Negative for chest pain, palpitations, orthopnea, claudication and leg swelling.   Gastrointestinal: Negative for diarrhea, nausea and vomiting.   Genitourinary: Negative for dysuria, frequency, hematuria and urgency.   Musculoskeletal: Negative for back pain.   Skin: Negative for rash.   Neurological: Negative for tingling, sensory change, speech change, weakness and headaches.   Psychiatric/Behavioral: Negative for depression. The patient is not nervous/anxious.      Physical Exam:   Virtual visit  Patient is alert awake oriented x3  No acute distress  Asking answer questions appropriately  No cough or use of accessory muscles to breathe  Able to complete full sentences without stopping  Normal affect    Assessment:   1. Gastroesophageal reflux disease without esophagitis  I recommended avoiding certain foods such as high citrus foods, chocolate and caffeine products.  The patient voiced understanding and stated that the famotidine is working well.    2. Essential hypertension  Stable and controlled on current medication.  Continue losartan and keep to a low-sodium diet.    3. Generalized anxiety disorder  Continue the patient on her Zoloft 50 mg daily and a prescription has been sent for alprazolam.  The patient was educated that this medication is not to be taken every day and only on an as-needed basis.  The patient is in agreement with the plan and voiced understanding.  - ALPRAZolam (XANAX) 0.25 mg tablet; Take 1 Tab by mouth daily as needed for Anxiety.  Dispense: 30 Tab; Refill: 0        Follow-up and Dispositions    ?? Return if symptoms worsen or fail to improve.         Signed By: Lyanne Co, MD     August 19, 2019       Note: This report was transcribed using voice recognition software and is thus   prone to syntax and contextual spelling errors. Please do not hesitate to call   me if anything needs clarification or a dictated addendum.

## 2019-08-19 NOTE — Progress Notes (Signed)
Progress Note    Patient: Victoria Campbell MRN: [id number]     Date of Birth: [birth year]  Age: 49 y.o.  Sex: female      Reason for Visit: No chief complaint on file.  The patient has given consent to a virtual visit and understands:    ??? The patient has the right to refuse a virtual visit and have a face-to-face visit.  ??? The patient has the right to know the professional credentials of the person who will be speaking to them.  ??? The patient has the right to know the location of the provider and what technology they will be using.  ??? The patient has the right to have appropriate staff available to them immediately while receiving telehealth services for emergencies or immediate needs.  ??? The patient has the right to know the identities of all persons on the call.  ??? The patient has the right to ask for a different provider and that, if they request a different provider, there may be a delay or a face-to-face visit may be needed.  ??? Telehealth calls cannot be recorded without the patient's consent.  ??? Translation services must be provided if needed.  ??? The patient must be informed that coinsurances and deductibles will be applied by Medicare and they are responsible for these balances.  Patient was read the rights and verbal consent was received  The provider Lyanne Co, MD is located at the office ([address] [place] [address]) and the patient, Victoria Campbell  is located work  Subjective:   This is a very pleasant 49 year old female with past medical history significant for gastroesophageal reflux disease, anxiety and hypertension.  The patient is currently at work.  She states that she is under increasing amounts of stress although the increased dose of the Zoloft has helped.  She is requesting a refill of the alprazolam.  She stated that in January went for her mammogram and she had some additional imaging done and then they requested that she have a short-term follow-up in 3 months.  She stated that she  thinks about that regularly and it has made her extra nervous.  The patient also stated that she is doing well on the famotidine although she forgets to take her second dose.    Pre-existing problem list  Patient Active Problem List   Diagnosis Code   ??? Generalized anxiety disorder F41.1   ??? Subclinical hypothyroidism E03.9   ??? Essential hypertension I10   ??? Seasonal allergic rhinitis due to pollen J30.1   ??? Gastroesophageal reflux disease without esophagitis K21.9       Allergies   Allergen Reactions   ??? Codeine Itching   ??? Erythromycin Base Diarrhea   ??? Penicillins Itching       Current Outpatient Medications   Medication Sig Dispense Refill   ??? ALPRAZolam (XANAX) 0.25 mg tablet Take 1 Tab by mouth daily as needed for Anxiety. 30 Tab 0   ??? losartan (COZAAR) 50 mg tablet TAKE 1 TABLET BY MOUTH EVERY DAY 90 Tab 0   ??? famotidine (Pepcid) 20 mg tablet Take 1 Tab by mouth two (2) times a day. 180 Tab 0   ??? sertraline (ZOLOFT) 50 mg tablet Take 1 Tab by mouth daily. 90 Tab 0   ??? montelukast (SINGULAIR) 10 mg tablet TAKE 1 TABLET BY MOUTH EVERY DAY 90 Tab 0   ??? levothyroxine (SYNTHROID) 25 mcg tablet Take  by mouth.  Social History     Tobacco Use   ??? Smoking status: Not on file   Substance Use Topics   ??? Alcohol use: Not on file       Objective:   No Acute Distress/Alert, Awake, Oriented x 3  Blood pressures checked at work was 137/92 and 132/82    Review of Systems   Constitutional: Negative for chills, fever and malaise/fatigue.   HENT: Negative for congestion, sore throat and tinnitus.    Eyes: Negative for blurred vision.   Respiratory: Negative for cough and shortness of breath.    Cardiovascular: Negative for chest pain, palpitations, orthopnea, claudication and leg swelling.   Gastrointestinal: Negative for diarrhea, nausea and vomiting.   Genitourinary: Negative for dysuria, frequency, hematuria and urgency.   Musculoskeletal: Negative for back pain.   Skin: Negative for rash.   Neurological: Negative for  tingling, sensory change, speech change, weakness and headaches.   Psychiatric/Behavioral: Negative for depression. The patient is not nervous/anxious.      Physical Exam:   Virtual visit  Patient is alert awake oriented x3  No acute distress  Asking answer questions appropriately  No cough or use of accessory muscles to breathe  Able to complete full sentences without stopping  Normal affect    Assessment:   1. Gastroesophageal reflux disease without esophagitis  I recommended avoiding certain foods such as high citrus foods, chocolate and caffeine products.  The patient voiced understanding and stated that the famotidine is working well.    2. Essential hypertension  Stable and controlled on current medication.  Continue losartan and keep to a low-sodium diet.    3. Generalized anxiety disorder  Continue the patient on her Zoloft 50 mg daily and a prescription has been sent for alprazolam.  The patient was educated that this medication is not to be taken every day and only on an as-needed basis.  The patient is in agreement with the plan and voiced understanding.  - ALPRAZolam (XANAX) 0.25 mg tablet; Take 1 Tab by mouth daily as needed for Anxiety.  Dispense: 30 Tab; Refill: 0        Follow-up and Dispositions    ?? Return if symptoms worsen or fail to improve.         Signed By: Quentin Angst, MD     August 19, 2019      Note: This report was transcribed using voice recognition software and is thus   prone to syntax and contextual spelling errors. Please do not hesitate to call   me if anything needs clarification or a dictated addendum.

## 2019-09-19 MED ORDER — MONTELUKAST 10 MG TAB
10 mg | ORAL_TABLET | ORAL | 0 refills | Status: DC
Start: 2019-09-19 — End: 2019-12-20

## 2019-09-28 DIAGNOSIS — E039 Hypothyroidism, unspecified: Secondary | ICD-10-CM | POA: Diagnosis not present

## 2019-09-28 DIAGNOSIS — E559 Vitamin D deficiency, unspecified: Secondary | ICD-10-CM | POA: Diagnosis not present

## 2019-10-04 DIAGNOSIS — R928 Other abnormal and inconclusive findings on diagnostic imaging of breast: Secondary | ICD-10-CM | POA: Diagnosis not present

## 2019-10-10 ENCOUNTER — Encounter

## 2019-10-10 MED ORDER — FAMOTIDINE 20 MG TAB
20 mg | ORAL_TABLET | ORAL | 0 refills | Status: DC
Start: 2019-10-10 — End: 2020-01-02

## 2019-10-10 MED ORDER — SERTRALINE 50 MG TAB
50 mg | ORAL_TABLET | ORAL | 0 refills | Status: DC
Start: 2019-10-10 — End: 2019-12-07

## 2019-12-05 MED ORDER — LOSARTAN 50 MG TAB
50 mg | ORAL_TABLET | Freq: Every day | ORAL | 0 refills | Status: DC
Start: 2019-12-05 — End: 2020-03-01

## 2019-12-07 ENCOUNTER — Ambulatory Visit
Admit: 2019-12-07 | Discharge: 2019-12-07 | Payer: PRIVATE HEALTH INSURANCE | Attending: Internal Medicine | Primary: Internal Medicine

## 2019-12-07 ENCOUNTER — Ambulatory Visit: Attending: Internal Medicine | Primary: Internal Medicine

## 2019-12-07 DIAGNOSIS — M25561 Pain in right knee: Secondary | ICD-10-CM

## 2019-12-07 DIAGNOSIS — Z6836 Body mass index (BMI) 36.0-36.9, adult: Secondary | ICD-10-CM | POA: Diagnosis not present

## 2019-12-07 DIAGNOSIS — G8929 Other chronic pain: Secondary | ICD-10-CM

## 2019-12-07 DIAGNOSIS — M1711 Unilateral primary osteoarthritis, right knee: Secondary | ICD-10-CM | POA: Diagnosis not present

## 2019-12-07 DIAGNOSIS — R69 Illness, unspecified: Secondary | ICD-10-CM | POA: Diagnosis not present

## 2019-12-07 MED ORDER — SERTRALINE 50 MG TAB
50 mg | ORAL_TABLET | ORAL | 0 refills | Status: DC
Start: 2019-12-07 — End: 2020-03-18

## 2019-12-07 MED ORDER — NABUMETONE 500 MG TAB
500 mg | ORAL_TABLET | Freq: Two times a day (BID) | ORAL | 0 refills | Status: DC
Start: 2019-12-07 — End: 2019-12-13

## 2019-12-07 MED ORDER — ALPRAZOLAM 0.25 MG TAB
0.25 mg | ORAL_TABLET | Freq: Every day | ORAL | 0 refills | Status: DC | PRN
Start: 2019-12-07 — End: 2020-06-07

## 2019-12-07 NOTE — Progress Notes (Signed)
Progress Note    Patient: Victoria Campbell MRN: [id number]     Date of Birth: Sep 25, 1970  Age: 49 y.o.  Sex: female      Reason for Visit:   Chief Complaint   Patient presents with   ??? Knee Pain   ??? Fall     Subjective:   This is a pleasant 49 year old female with past medical history significant for anxiety and hypertension.  The patient stated that she was recently in a shower and slipped and fell injuring her right knee.  She stated her right knee has always had chronic pain but more so recently after the fall.  She also stated that she is having trouble sleeping.  The patient notified me that she went to see Dr. Rennis Harding and is scheduled for an upper endoscopy.    Pre-existing problem list  Patient Active Problem List   Diagnosis Code   ??? Generalized anxiety disorder F41.1   ??? Subclinical hypothyroidism E03.9   ??? Essential hypertension I10   ??? Seasonal allergic rhinitis due to pollen J30.1   ??? Gastroesophageal reflux disease without esophagitis K21.9       Allergies   Allergen Reactions   ??? Codeine Itching   ??? Erythromycin Base Diarrhea   ??? Penicillins Itching       Current Outpatient Medications   Medication Sig Dispense Refill   ??? sertraline (ZOLOFT) 50 mg tablet TAKE 1 TABLET BY MOUTH EVERY DAY 90 Tablet 0   ??? ALPRAZolam (XANAX) 0.25 mg tablet Take 1 Tablet by mouth daily as needed for Anxiety. 30 Tablet 0   ??? nabumetone (RELAFEN) 500 mg tablet Take 1 Tablet by mouth two (2) times a day. 20 Tablet 0   ??? losartan (COZAAR) 50 mg tablet Take 1 Tablet by mouth daily. 90 Tablet 0   ??? famotidine (PEPCID) 20 mg tablet TAKE 1 TABLET BY MOUTH TWICE A DAY 180 Tab 0   ??? montelukast (SINGULAIR) 10 mg tablet TAKE 1 TABLET BY MOUTH EVERY DAY 90 Tab 0   ??? levothyroxine (SYNTHROID) 25 mcg tablet Take  by mouth.       Patient is tolerating all medications as prescribed with no barriers to adherence noted.     Past Medical History:   Diagnosis Date   ??? Acid reflux    ??? Anxiety    ??? GERD (gastroesophageal reflux disease)        Past  Surgical History:   Procedure Laterality Date   ??? HX HEENT      Tonsilectomy   ??? PR ABDOMEN SURGERY PROC UNLISTED      Umbilical Hernia repair   ??? PR BREAST AUGMENTATION WITH IMPLANT Bilateral        Family History   Problem Relation Age of Onset   ??? Osteoporosis Mother    ??? Cancer Father    ??? Diabetes Father    ??? Heart Attack Maternal Grandfather    ??? Heart Attack Paternal Grandmother        Social History     Tobacco Use   ??? Smoking status: Never Smoker   ??? Smokeless tobacco: Never Used   Substance Use Topics   ??? Alcohol use: Yes     Comment: occasionally       Objective:   No Acute Distress/Alert, Awake, Oriented x 3  Vitals:    12/07/19 1522   BP: (P) 120/80   Pulse: (P) 80   Resp: (P) 16   Temp: 98.1 ??F (36.7 ??C)  Weight: 87.3 kg (192 lb 8 oz)   Height: 5\' 1"  (1.549 m)        Review of Systems   Constitutional: Negative for chills, fever and malaise/fatigue.   HENT: Negative for congestion, sore throat and tinnitus.    Eyes: Negative for double vision.   Respiratory: Negative for cough and shortness of breath.    Cardiovascular: Negative for chest pain, palpitations, orthopnea, claudication and leg swelling.   Gastrointestinal: Negative for diarrhea, nausea and vomiting.   Genitourinary: Negative for dysuria, frequency, hematuria and urgency.   Musculoskeletal: Positive for joint pain. Negative for back pain.        Right knee   Skin: Negative for rash.   Neurological: Negative for tingling, sensory change, speech change, weakness and headaches.   Psychiatric/Behavioral: Negative for depression. The patient has insomnia.      Physical Exam:   General:  Alert, awake, oriented x3 cooperative, no acute distress.   Head:  Normocephalic, without obvious abnormality, atraumatic.   Eyes/Ears:  Conjunctivae/corneas clear.    Lungs:   Clear to auscultation bilaterally.  No wheezes rhonchi or crackles   Chest wall:  No tenderness or deformity.  Expanding and contracting symmetrically   Heart:  Regular rate and rhythm,  S1, S2 normal       Extremities: Extremities normal, atraumatic, no cyanosis or edema.  Full range of motion x4.  Right knee without any erythema or edema but noted crepitus on flexion and extension                     Neurologic: CNII-XII intact. No focal deficits     Assessment:   1. Chronic pain of right knee  Discussed with the patient starting work-up with an x-ray.  I also recommended that she use some anti-inflammatories.  She is to take the anti-inflammatory with food and sparingly to avoid any gastric upset.  The patient voiced understanding and is in agreement with the plan  - XR KNEE RT 3 V; Future  - nabumetone (RELAFEN) 500 mg tablet; Take 1 Tablet by mouth two (2) times a day.  Dispense: 20 Tablet; Refill: 0    2. Generalized anxiety disorder  We will continue her Zoloft daily and recommended the alprazolam as needed.  Due to the patient's difficulty sleeping I advised her that she should try the alprazolam in the evening.  The patient voiced understanding and again is in agreement with the plan  - sertraline (ZOLOFT) 50 mg tablet; TAKE 1 TABLET BY MOUTH EVERY DAY  Dispense: 90 Tablet; Refill: 0  - ALPRAZolam (XANAX) 0.25 mg tablet; Take 1 Tablet by mouth daily as needed for Anxiety.  Dispense: 30 Tablet; Refill: 0    Medications discussed, including all new medications prescribed at this visit.  Indications and side effects reviewed with the patient/family/caregiver with understanding verbalized      Follow-up and Dispositions    ?? Return in about 6 months (around 06/07/2020) for follow up.       I had a conversation with the patient regarding the COVID-19 vaccine.  She is hesitant to obtain the vaccine.  I explained to her the differences and the benefits of obtaining the vaccine.  The patient stated that she will think it over.    Signed By: 14/02/2020, MD     December 07, 2019      Note: This report was transcribed using voice recognition software and is thus   prone to syntax and  contextual spelling  errors. Please do not hesitate to call   me if anything needs clarification or a dictated addendum.

## 2019-12-08 ENCOUNTER — Encounter

## 2019-12-08 NOTE — Progress Notes (Signed)
Mild degenerative disease of the knee

## 2019-12-08 NOTE — Progress Notes (Signed)
Pt. Aware and voiced understanding.

## 2019-12-13 ENCOUNTER — Ambulatory Visit
Admit: 2019-12-13 | Discharge: 2019-12-13 | Payer: PRIVATE HEALTH INSURANCE | Attending: Internal Medicine | Primary: Internal Medicine

## 2019-12-13 ENCOUNTER — Ambulatory Visit: Attending: Internal Medicine | Primary: Internal Medicine

## 2019-12-13 DIAGNOSIS — M545 Low back pain, unspecified: Secondary | ICD-10-CM

## 2019-12-13 DIAGNOSIS — Z6836 Body mass index (BMI) 36.0-36.9, adult: Secondary | ICD-10-CM | POA: Diagnosis not present

## 2019-12-13 MED ORDER — KETOROLAC TROMETHAMINE 60 MG/2 ML IM
60 mg/2 mL | Freq: Once | INTRAMUSCULAR | Status: AC
Start: 2019-12-13 — End: 2019-12-13
  Administered 2019-12-13: 15:00:00 via INTRAMUSCULAR

## 2019-12-13 MED ORDER — CYCLOBENZAPRINE 5 MG TAB
5 mg | ORAL_TABLET | Freq: Every evening | ORAL | 0 refills | Status: DC | PRN
Start: 2019-12-13 — End: 2020-06-07

## 2019-12-13 MED ORDER — NAPROXEN SODIUM 550 MG TAB
550 mg | ORAL_TABLET | Freq: Two times a day (BID) | ORAL | 0 refills | Status: AC
Start: 2019-12-13 — End: 2019-12-23

## 2019-12-13 NOTE — Progress Notes (Signed)
Progress Note    Patient: Victoria Campbell MRN: [id number]     Date of Birth: July 11, 1970  Age: 49 y.o.  Sex: female      Reason for Visit:   Chief Complaint   Patient presents with   ??? Back Pain     lower back since yesterday      Subjective:   This is a pleasant 49 year old female with past medical history significant for hypertension, hypothyroidism and anxiety.  The patient stated that yesterday she started feeling some lower back pain.  She denied any acute injury and stated that nothing and to her recollection happened to cause the pain.  She slept well last night and awoke this morning with severe lower back pain that radiates throughout the lower portion of her back and is causing her trouble walking and standing up straight.    Pre-existing problem list  Patient Active Problem List   Diagnosis Code   ??? Generalized anxiety disorder F41.1   ??? Subclinical hypothyroidism E03.9   ??? Essential hypertension I10   ??? Seasonal allergic rhinitis due to pollen J30.1   ??? Gastroesophageal reflux disease without esophagitis K21.9       Allergies   Allergen Reactions   ??? Codeine Itching   ??? Erythromycin Base Diarrhea   ??? Penicillins Itching       Current Outpatient Medications   Medication Sig Dispense Refill   ??? naproxen sodium (ANAPROX) 550 mg tablet Take 1 Tablet by mouth two (2) times daily (with meals) for 10 days. 20 Tablet 0   ??? cyclobenzaprine (FLEXERIL) 5 mg tablet Take 1 Tablet by mouth nightly as needed for Muscle Spasm(s). 10 Tablet 0   ??? sertraline (ZOLOFT) 50 mg tablet TAKE 1 TABLET BY MOUTH EVERY DAY 90 Tablet 0   ??? ALPRAZolam (XANAX) 0.25 mg tablet Take 1 Tablet by mouth daily as needed for Anxiety. 30 Tablet 0   ??? losartan (COZAAR) 50 mg tablet Take 1 Tablet by mouth daily. 90 Tablet 0   ??? famotidine (PEPCID) 20 mg tablet TAKE 1 TABLET BY MOUTH TWICE A DAY 180 Tab 0   ??? montelukast (SINGULAIR) 10 mg tablet TAKE 1 TABLET BY MOUTH EVERY DAY 90 Tab 0   ??? levothyroxine (SYNTHROID) 25 mcg tablet Take  by mouth.        Current Facility-Administered Medications   Medication Dose Route Frequency Provider Last Rate Last Admin   ??? ketorolac tromethamine (TORADOL) 60 mg/2 mL injection 60 mg  60 mg IntraMUSCular ONCE Khalessi Blough, MD         Patient is tolerating all medications as prescribed with no barriers to adherence noted.     Past Medical History:   Diagnosis Date   ??? Acid reflux    ??? Anxiety    ??? GERD (gastroesophageal reflux disease)        Past Surgical History:   Procedure Laterality Date   ??? HX HEENT      Tonsilectomy   ??? PR ABDOMEN SURGERY PROC UNLISTED      Umbilical Hernia repair   ??? PR BREAST AUGMENTATION WITH IMPLANT Bilateral        Family History   Problem Relation Age of Onset   ??? Osteoporosis Mother    ??? Cancer Father    ??? Diabetes Father    ??? Heart Attack Maternal Grandfather    ??? Heart Attack Paternal Grandmother        Social History     Tobacco Use   ???  Smoking status: Never Smoker   ??? Smokeless tobacco: Never Used   Substance Use Topics   ??? Alcohol use: Yes     Comment: occasionally       Objective:   No Acute Distress/Alert, Awake, Oriented x 3  Vitals:    12/13/19 1058   BP: 132/86   Pulse: 76   Resp: 16   Temp: 97.4 ??F (36.3 ??C)   Weight: 88 kg (194 lb)   Height: 5\' 1"  (1.549 m)        Review of Systems   Constitutional: Negative for chills, fever and malaise/fatigue.   HENT: Negative for congestion, ear pain and sore throat.    Respiratory: Negative for cough and shortness of breath.    Cardiovascular: Negative for chest pain, palpitations, orthopnea, claudication and leg swelling.   Gastrointestinal: Negative for diarrhea, nausea and vomiting.   Genitourinary: Negative for dysuria, frequency, hematuria and urgency.   Musculoskeletal: Positive for back pain and myalgias.   Skin: Negative for rash.   Neurological: Negative for tingling, sensory change, speech change, weakness and headaches.   Psychiatric/Behavioral: Negative for depression. The patient is not nervous/anxious.      Physical Exam:    General:  Alert, awake, oriented x3 cooperative, no acute distress.   Head:  Normocephalic, without obvious abnormality, atraumatic.   Eyes/Ears:  Conjunctivae/corneas clear.    Lungs:   Clear to auscultation bilaterally.  No wheezes rhonchi or crackles   Chest wall:  No tenderness or deformity.  Expanding and contracting symmetrically   Heart:  Regular rate and rhythm, S1, S2 normal   Abdomen:   Soft, non-tender. Bowel sounds normal.    Extremities: Extremities normal, atraumatic, no cyanosis or edema.  Full range of motion x4       Musculoskeletal:  Pain with movement of the lower back.  No edema erythema or ecchymosis at site.  Patient has pain with flexion and extension.  Able to induce pain also with arm elevation above head             Neurologic: CNII-XII intact.  Motor and sensory intact. no focal deficits     Assessment:   1. Acute bilateral low back pain without sciatica  Toradol 60 mg IM x1 administered by , RN.  Patient to trial NSAIDs twice daily and muscle relaxant at night as needed.  If not better in the next few days she is to return for reevaluation.  The patient voiced understanding and is in agreement with the plan.  Patient was also advised to stop the Relafen.  - ketorolac tromethamine (TORADOL) 60 mg/2 mL injection 60 mg  - naproxen sodium (ANAPROX) 550 mg tablet; Take 1 Tablet by mouth two (2) times daily (with meals) for 10 days.  Dispense: 20 Tablet; Refill: 0  - cyclobenzaprine (FLEXERIL) 5 mg tablet; Take 1 Tablet by mouth nightly as needed for Muscle Spasm(s).  Dispense: 10 Tablet; Refill: 0    Medications discussed, including all new medications prescribed at this visit.  Indications and side effects reviewed with the patient/family/caregiver with understanding verbalized    Follow-up and Dispositions    ?? Return if symptoms worsen or fail to improve.         Signed By: Frutoso Schatz, MD     December 13, 2019      Note: This report was transcribed using voice recognition  software and is thus   prone to syntax and contextual spelling errors. Please do not hesitate to  call   me if anything needs clarification or a dictated addendum.

## 2019-12-20 MED ORDER — MONTELUKAST 10 MG TAB
10 mg | ORAL_TABLET | ORAL | 0 refills | Status: DC
Start: 2019-12-20 — End: 2020-03-18

## 2020-01-03 DIAGNOSIS — Z20822 Contact with and (suspected) exposure to covid-19: Secondary | ICD-10-CM | POA: Diagnosis not present

## 2020-01-03 DIAGNOSIS — Z01812 Encounter for preprocedural laboratory examination: Secondary | ICD-10-CM | POA: Diagnosis not present

## 2020-01-03 MED ORDER — FAMOTIDINE 20 MG TAB
20 mg | ORAL_TABLET | ORAL | 0 refills | Status: DC
Start: 2020-01-03 — End: 2020-06-07

## 2020-01-05 DIAGNOSIS — K2940 Chronic atrophic gastritis without bleeding: Secondary | ICD-10-CM | POA: Diagnosis not present

## 2020-01-05 DIAGNOSIS — D131 Benign neoplasm of stomach: Secondary | ICD-10-CM | POA: Diagnosis not present

## 2020-01-05 DIAGNOSIS — K2960 Other gastritis without bleeding: Secondary | ICD-10-CM | POA: Diagnosis not present

## 2020-01-05 DIAGNOSIS — K219 Gastro-esophageal reflux disease without esophagitis: Secondary | ICD-10-CM | POA: Diagnosis not present

## 2020-01-05 DIAGNOSIS — J45909 Unspecified asthma, uncomplicated: Secondary | ICD-10-CM | POA: Diagnosis not present

## 2020-01-05 DIAGNOSIS — R1013 Epigastric pain: Secondary | ICD-10-CM | POA: Diagnosis not present

## 2020-01-05 DIAGNOSIS — K317 Polyp of stomach and duodenum: Secondary | ICD-10-CM | POA: Diagnosis not present

## 2020-01-09 DIAGNOSIS — K219 Gastro-esophageal reflux disease without esophagitis: Secondary | ICD-10-CM | POA: Diagnosis not present

## 2020-03-01 MED ORDER — LOSARTAN 50 MG TAB
50 mg | ORAL_TABLET | ORAL | 0 refills | Status: DC
Start: 2020-03-01 — End: 2020-06-07

## 2020-03-14 NOTE — Telephone Encounter (Signed)
Please review the chart. This was refilled one week ago

## 2020-03-15 NOTE — Telephone Encounter (Signed)
Patient is aware medication has been refilled and is at pharmacy.

## 2020-03-17 ENCOUNTER — Encounter

## 2020-03-18 MED ORDER — MONTELUKAST 10 MG TAB
10 mg | ORAL_TABLET | ORAL | 0 refills | Status: DC
Start: 2020-03-18 — End: 2020-06-11

## 2020-03-18 MED ORDER — SERTRALINE 50 MG TAB
50 mg | ORAL_TABLET | ORAL | 0 refills | Status: DC
Start: 2020-03-18 — End: 2020-06-11

## 2020-03-30 DIAGNOSIS — E559 Vitamin D deficiency, unspecified: Secondary | ICD-10-CM | POA: Diagnosis not present

## 2020-03-30 DIAGNOSIS — E039 Hypothyroidism, unspecified: Secondary | ICD-10-CM | POA: Diagnosis not present

## 2020-06-07 ENCOUNTER — Ambulatory Visit
Admit: 2020-06-07 | Discharge: 2020-06-07 | Payer: PRIVATE HEALTH INSURANCE | Attending: Internal Medicine | Primary: Internal Medicine

## 2020-06-07 ENCOUNTER — Ambulatory Visit: Attending: Internal Medicine | Primary: Internal Medicine

## 2020-06-07 DIAGNOSIS — M25551 Pain in right hip: Secondary | ICD-10-CM

## 2020-06-07 DIAGNOSIS — N390 Urinary tract infection, site not specified: Secondary | ICD-10-CM | POA: Diagnosis not present

## 2020-06-07 DIAGNOSIS — Z6835 Body mass index (BMI) 35.0-35.9, adult: Secondary | ICD-10-CM | POA: Diagnosis not present

## 2020-06-07 DIAGNOSIS — I10 Essential (primary) hypertension: Secondary | ICD-10-CM | POA: Diagnosis not present

## 2020-06-07 DIAGNOSIS — K219 Gastro-esophageal reflux disease without esophagitis: Secondary | ICD-10-CM | POA: Diagnosis not present

## 2020-06-07 DIAGNOSIS — R69 Illness, unspecified: Secondary | ICD-10-CM | POA: Diagnosis not present

## 2020-06-07 LAB — AMB POC URINALYSIS DIP STICK AUTO W/O MICRO
Bilirubin (UA POC): NEGATIVE
Bilirubin, Urine, POC: NEGATIVE
Glucose (UA POC): NEGATIVE
Glucose, Urine, POC: NEGATIVE
Ketones (UA POC): NEGATIVE
Ketones, Urine, POC: NEGATIVE
Leukocyte Esterase, Urine, POC: NEGATIVE
Leukocyte esterase (UA POC): NEGATIVE
Nitrite, Urine, POC: NEGATIVE
Nitrites (UA POC): NEGATIVE
Specific Gravity, Urine, POC: 1.03 NA (ref 1.001–1.035)
Specific gravity (UA POC): 1.03 (ref 1.001–1.035)
Urobilinogen (UA POC): 0.2 (ref 0.2–1)
Urobilinogen, POC: 0.2 (ref 0.2–1)
pH (UA POC): 5.5 (ref 4.6–8.0)
pH, Urine, POC: 5.5 NA (ref 4.6–8.0)

## 2020-06-07 MED ORDER — LOSARTAN 50 MG TAB
50 mg | ORAL_TABLET | Freq: Every day | ORAL | 1 refills | Status: AC
Start: 2020-06-07 — End: ?

## 2020-06-07 MED ORDER — CIPROFLOXACIN 250 MG TAB
250 mg | ORAL_TABLET | Freq: Two times a day (BID) | ORAL | 0 refills | Status: AC
Start: 2020-06-07 — End: 2020-06-10

## 2020-06-07 MED ORDER — ALPRAZOLAM 0.25 MG TAB
0.25 mg | ORAL_TABLET | Freq: Every day | ORAL | 0 refills | Status: AC | PRN
Start: 2020-06-07 — End: ?

## 2020-06-07 MED ORDER — FAMOTIDINE 20 MG TAB
20 mg | ORAL_TABLET | Freq: Two times a day (BID) | ORAL | 1 refills | Status: AC
Start: 2020-06-07 — End: ?
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
## 2020-06-11 ENCOUNTER — Encounter

## 2020-06-11 MED ORDER — SERTRALINE 50 MG TAB
50 mg | ORAL_TABLET | ORAL | 0 refills | Status: DC
Start: 2020-06-11 — End: 2020-09-05

## 2020-06-11 MED ORDER — MONTELUKAST 10 MG TAB
10 mg | ORAL_TABLET | ORAL | 0 refills | Status: DC
Start: 2020-06-11 — End: 2020-09-05

## 2020-06-18 DIAGNOSIS — R928 Other abnormal and inconclusive findings on diagnostic imaging of breast: Secondary | ICD-10-CM | POA: Diagnosis not present

## 2020-06-28 ENCOUNTER — Telehealth
Admit: 2020-06-28 | Discharge: 2020-06-28 | Payer: PRIVATE HEALTH INSURANCE | Attending: Internal Medicine | Primary: Internal Medicine

## 2020-06-28 ENCOUNTER — Telehealth: Attending: Internal Medicine | Primary: Internal Medicine

## 2020-06-28 DIAGNOSIS — H66001 Acute suppurative otitis media without spontaneous rupture of ear drum, right ear: Secondary | ICD-10-CM

## 2020-06-28 DIAGNOSIS — N390 Urinary tract infection, site not specified: Secondary | ICD-10-CM | POA: Diagnosis not present

## 2020-06-28 MED ORDER — TRIMETHOPRIM-SULFAMETHOXAZOLE 160 MG-800 MG TAB
160-800 mg | ORAL_TABLET | Freq: Two times a day (BID) | ORAL | 0 refills | Status: AC
Start: 2020-06-28 — End: 2020-07-05

## 2020-06-28 NOTE — Progress Notes (Signed)
Progress Note    Patient: Victoria Campbell MRN: [id number]     Date of Birth: Mar 08, 1971  Age: 49 y.o.  Sex: female      Reason for Visit: No chief complaint on file.  The patient has given consent to a virtual visit and understands:    ??? The patient has the right to refuse a virtual visit and have a face-to-face visit.  ??? The patient has the right to know the professional credentials of the person who will be speaking to them.  ??? The patient has the right to know the location of the provider and what technology they will be using, D[employer].  ??? The patient has the right to have appropriate staff available to them immediately while receiving telehealth services for emergencies or immediate needs.  ??? The patient has the right to know the identities of all persons on the call.  ??? The patient has the right to ask for a different provider and that, if they request a different provider, there may be a delay or a face-to-face visit may be needed.  ??? Telehealth calls cannot be recorded without the patient's consent.  ??? Translation services must be provided if needed.  ??? The patient must be informed that coinsurances and deductibles will be applied by Medicare and they are responsible for these balances.  Patient was read the rights and verbal consent was received  The provider Quentin Angst, MD is located at the office ([address] [place], [place] [address]) and the patient, Victoria Campbell  is located in her car    Subjective:   This is a pleasant 49 year old female that scheduled this appointment today because she woke with right ear pain that is now traveling down to her throat.  She had a rapid COVID-19 test completed which was negative.  The patient is without headache and without any cough congestion or fevers.  She stated that otherwise she is feeling well.  She did state that the antibiotic helped her urine but is still cloudy and she is still does not feel like it is completely resolved.    Pre-existing problem list  Patient  Active Problem List   Diagnosis Code   ??? Generalized anxiety disorder F41.1   ??? Subclinical hypothyroidism E03.8   ??? Essential hypertension I10   ??? Seasonal allergic rhinitis due to pollen J30.1   ??? Gastroesophageal reflux disease without esophagitis K21.9       Allergies   Allergen Reactions   ??? Codeine Itching   ??? Erythromycin Base Diarrhea   ??? Penicillins Itching       Current Outpatient Medications   Medication Sig Dispense Refill   ??? trimethoprim-sulfamethoxazole (BACTRIM DS, SEPTRA DS) 160-800 mg per tablet Take 1 Tablet by mouth two (2) times a day for 7 days. 14 Tablet 0   ??? sertraline (ZOLOFT) 50 mg tablet TAKE 1 TABLET BY MOUTH EVERY DAY 90 Tablet 0   ??? montelukast (SINGULAIR) 10 mg tablet TAKE 1 TABLET BY MOUTH EVERY DAY 90 Tablet 0   ??? ALPRAZolam (XANAX) 0.25 mg tablet Take 1 Tablet by mouth daily as needed for Anxiety. 30 Tablet 0   ??? losartan (COZAAR) 50 mg tablet Take 1 Tablet by mouth daily. 90 Tablet 1   ??? famotidine (PEPCID) 20 mg tablet Take 1 Tablet by mouth two (2) times a day. 180 Tablet 1   ??? levothyroxine (SYNTHROID) 25 mcg tablet Take  by mouth.       Patient is  tolerating all medications as prescribed with no barriers to adherence noted.     Past Medical History:   Diagnosis Date   ??? Acid reflux    ??? Anxiety    ??? Asthma    ??? Depression    ??? GERD (gastroesophageal reflux disease)    ??? Hypertension    ??? Thyroid disease        Past Surgical History:   Procedure Laterality Date   ??? HX HEENT      Tonsilectomy   ??? PR ABDOMEN SURGERY PROC UNLISTED      Umbilical Hernia repair   ??? PR BREAST AUGMENTATION WITH IMPLANT Bilateral 2006    Saline       Family History   Problem Relation Age of Onset   ??? Osteoporosis Mother    ??? Cancer Father    ??? Diabetes Father    ??? Heart Attack Maternal Grandfather    ??? Heart Attack Paternal Grandmother        Social History     Tobacco Use   ??? Smoking status: Never Smoker   ??? Smokeless tobacco: Never Used   Substance Use Topics   ??? Alcohol use: Yes     Comment:  occasionally       Objective:   No Acute Distress/Alert, Awake, Oriented x 3  There were no vitals filed for this visit.     Review of Systems   Constitutional: Negative for chills, fever and malaise/fatigue.   HENT: Negative for congestion, sore throat and tinnitus.    Respiratory: Negative for cough and shortness of breath.    Cardiovascular: Negative for chest pain, palpitations, orthopnea, claudication and leg swelling.   Gastrointestinal: Negative for diarrhea, nausea and vomiting.   Genitourinary: Positive for frequency and urgency. Negative for dysuria and hematuria.        Cloudy urine   Musculoskeletal: Negative for back pain.   Skin: Negative for rash.   Neurological: Negative for tingling, sensory change, speech change, weakness and headaches.   Psychiatric/Behavioral: Negative for depression. The patient is not nervous/anxious.      Physical Exam:   Virtual visit  Patient is alert awake oriented x3  No acute distress  Asking and answering questions appropriately  No cough or use of accessory muscles to breathe  Able to complete full sentences without stopping    Assessment:   1. Non-recurrent acute suppurative otitis media of right ear without spontaneous rupture of tympanic membrane  Patient aware to return if not better.  - trimethoprim-sulfamethoxazole (BACTRIM DS, SEPTRA DS) 160-800 mg per tablet; Take 1 Tablet by mouth two (2) times a day for 7 days.  Dispense: 14 Tablet; Refill: 0    2. Urinary tract infection without hematuria, site unspecified  Using Bactrim to cover for both UTI and the ear infection.  She was encouraged to keep drinking water and stay hydrated.  If not better she is to return for evaluation with a repeat urinalysis.  - trimethoprim-sulfamethoxazole (BACTRIM DS, SEPTRA DS) 160-800 mg per tablet; Take 1 Tablet by mouth two (2) times a day for 7 days.  Dispense: 14 Tablet; Refill: 0    Medications discussed, including all new medications prescribed at this visit.  Indications and  side effects reviewed with the patient/family/caregiver with understanding verbalized        Follow-up and Dispositions    ?? Return if symptoms worsen or fail to improve.         Signed By: Quentin Angst, MD  June 28, 2020      Note: This report was transcribed using voice recognition software and is thus   prone to syntax and contextual spelling errors. Please do not hesitate to call   me if anything needs clarification or a dictated addendum.

## 2020-07-30 ENCOUNTER — Telehealth
Admit: 2020-07-30 | Discharge: 2020-07-30 | Payer: PRIVATE HEALTH INSURANCE | Attending: Internal Medicine | Primary: Internal Medicine

## 2020-07-30 ENCOUNTER — Telehealth: Attending: Internal Medicine | Primary: Internal Medicine

## 2020-07-30 DIAGNOSIS — T50B95A Adverse effect of other viral vaccines, initial encounter: Secondary | ICD-10-CM

## 2020-07-30 DIAGNOSIS — R21 Rash and other nonspecific skin eruption: Secondary | ICD-10-CM | POA: Diagnosis not present

## 2020-07-30 NOTE — Progress Notes (Signed)
Progress Note    Patient: Victoria Campbell MRN: [id number]     Date of Birth: 06-10-1971  Age: 50 y.o.  Sex: female      Reason for Visit: No chief complaint on file.  The patient has given consent to a virtual visit and understands:    ??? The patient has the right to refuse a virtual visit and have a face-to-face visit.  ??? The patient has the right to know the professional credentials of the person who will be speaking to them.  ??? The patient has the right to know the location of the provider and what technology they will be using, [employer].  ??? The patient has the right to have appropriate staff available to them immediately while receiving telehealth services for emergencies or immediate needs.  ??? The patient has the right to know the identities of all persons on the call.  ??? The patient has the right to ask for a different provider and that, if they request a different provider, there may be a delay or a face-to-face visit may be needed.  ??? Telehealth calls cannot be recorded without the patient's consent.  ??? Translation services must be provided if needed.  ??? The patient must be informed that coinsurances and deductibles will be applied by Medicare and they are responsible for these balances.  Patient was read the rights and verbal consent was received  The provider Quentin Angst, MD is located at the office ([address] [place], [place] [address]) and the patient, Victoria Campbell  is located at their home    Subjective:   This is a pleasant 50 year old female with past medical history significant for anxiety and hypertension.  The patient stated that she got her messenger RNA COVID-19 vaccine on July 26, 2020.  This was the first dose of herModerna series.  The patient stated on July 27, 2020 she came out of shower and noticed that she had 3 red blotches on the arm of the injection site.  Since she has been taking Tylenol as needed and using hydrocortisone cream on the site.  The patient is not short of breath and no  other red areas noted.  The patient denied any fevers.    Pre-existing problem list  Patient Active Problem List   Diagnosis Code   ??? Generalized anxiety disorder F41.1   ??? Subclinical hypothyroidism E03.8   ??? Essential hypertension I10   ??? Seasonal allergic rhinitis due to pollen J30.1   ??? Gastroesophageal reflux disease without esophagitis K21.9       Allergies   Allergen Reactions   ??? Codeine Itching   ??? Erythromycin Base Diarrhea   ??? Penicillins Itching       Current Outpatient Medications   Medication Sig Dispense Refill   ??? sertraline (ZOLOFT) 50 mg tablet TAKE 1 TABLET BY MOUTH EVERY DAY 90 Tablet 0   ??? montelukast (SINGULAIR) 10 mg tablet TAKE 1 TABLET BY MOUTH EVERY DAY 90 Tablet 0   ??? ALPRAZolam (XANAX) 0.25 mg tablet Take 1 Tablet by mouth daily as needed for Anxiety. 30 Tablet 0   ??? losartan (COZAAR) 50 mg tablet Take 1 Tablet by mouth daily. 90 Tablet 1   ??? famotidine (PEPCID) 20 mg tablet Take 1 Tablet by mouth two (2) times a day. 180 Tablet 1   ??? levothyroxine (SYNTHROID) 25 mcg tablet Take  by mouth.       Patient is tolerating all medications as prescribed with no barriers to  adherence noted.     Past Medical History:   Diagnosis Date   ??? Acid reflux    ??? Anxiety    ??? Asthma    ??? Depression    ??? GERD (gastroesophageal reflux disease)    ??? Hypertension    ??? Thyroid disease        Past Surgical History:   Procedure Laterality Date   ??? HX HEENT      Tonsilectomy   ??? PR ABDOMEN SURGERY PROC UNLISTED      Umbilical Hernia repair   ??? PR BREAST AUGMENTATION WITH IMPLANT Bilateral 2006    Saline       Family History   Problem Relation Age of Onset   ??? Osteoporosis Mother    ??? Cancer Father    ??? Diabetes Father    ??? Heart Attack Maternal Grandfather    ??? Heart Attack Paternal Grandmother        Social History     Tobacco Use   ??? Smoking status: Never Smoker   ??? Smokeless tobacco: Never Used   Substance Use Topics   ??? Alcohol use: Yes     Comment: occasionally       Objective:   No Acute Distress/Alert,  Awake, Oriented x 3  There were no vitals filed for this visit.     Review of Systems   Constitutional: Negative for chills, fever and malaise/fatigue.   HENT: Negative for congestion and sore throat.    Respiratory: Negative for cough and shortness of breath.    Cardiovascular: Negative for chest pain, palpitations, orthopnea, claudication and leg swelling.   Gastrointestinal: Negative for diarrhea, nausea and vomiting.   Genitourinary: Negative for dysuria, frequency, hematuria and urgency.   Musculoskeletal: Negative for back pain.   Skin: Positive for rash.        Same arm as site of COVID-19 injection   Neurological: Negative for tingling, weakness and headaches.   Psychiatric/Behavioral: Negative for depression.       Physical Exam:   Virtual visit  Patient is alert awake oriented x3  No acute distress  Asking and answering questions appropriately  No cough or use of accessory muscles to breathe  Able to complete full sentences without stopping  Normal affect  Left arm shows 2 large circular red blotches with linear blotch on the posterior aspect  No stranding of areas of erythema    Assessment:   1. Adverse effect of COVID-19 vaccine  Patient appears to have "Covid arm." I counseled patient on supportive care with antihistamine and Tylenol as needed.  The patient voiced understanding and will contact us with any questions or concerns.    Medications discussed, including all new medications prescribed at this visit.  Indications and side effects reviewed with the patient/family/caregiver with understanding verbalized      Follow-up and Dispositions    ?? Return if symptoms worsen or fail to improve.         Signed By: Quentin Angst, MD     July 30, 2020      Note: This report was transcribed using voice recognition software and is thus   prone to syntax and contextual spelling errors. Please do not hesitate to call   me if anything needs clarification or a dictated addendum.

## 2020-09-05 ENCOUNTER — Encounter

## 2020-09-05 MED ORDER — SERTRALINE 50 MG TAB
50 mg | ORAL_TABLET | ORAL | 0 refills | Status: AC
Start: 2020-09-05 — End: ?

## 2020-09-05 MED ORDER — MONTELUKAST 10 MG TAB
10 mg | ORAL_TABLET | ORAL | 0 refills | Status: AC
Start: 2020-09-05 — End: ?

## 2020-10-23 ENCOUNTER — Encounter

## 2020-11-19 ENCOUNTER — Ambulatory Visit (INDEPENDENT_AMBULATORY_CARE_PROVIDER_SITE_OTHER): Payer: 59 | Admitting: Internal Medicine

## 2020-11-19 ENCOUNTER — Encounter (INDEPENDENT_AMBULATORY_CARE_PROVIDER_SITE_OTHER): Payer: Self-pay | Admitting: Internal Medicine

## 2020-11-19 ENCOUNTER — Other Ambulatory Visit: Payer: Self-pay

## 2020-11-19 VITALS — BP 130/100 | HR 72 | Ht 62.0 in | Wt 200.4 lb

## 2020-11-19 DIAGNOSIS — I10 Essential (primary) hypertension: Secondary | ICD-10-CM | POA: Diagnosis not present

## 2020-11-19 DIAGNOSIS — R5383 Other fatigue: Secondary | ICD-10-CM | POA: Diagnosis not present

## 2020-11-19 DIAGNOSIS — Z131 Encounter for screening for diabetes mellitus: Secondary | ICD-10-CM | POA: Diagnosis not present

## 2020-11-19 DIAGNOSIS — R5381 Other malaise: Secondary | ICD-10-CM

## 2020-11-19 DIAGNOSIS — E559 Vitamin D deficiency, unspecified: Secondary | ICD-10-CM | POA: Diagnosis not present

## 2020-11-19 DIAGNOSIS — E039 Hypothyroidism, unspecified: Secondary | ICD-10-CM

## 2020-11-19 DIAGNOSIS — R69 Illness, unspecified: Secondary | ICD-10-CM | POA: Diagnosis not present

## 2020-11-19 DIAGNOSIS — F32A Depression, unspecified: Secondary | ICD-10-CM | POA: Diagnosis not present

## 2020-11-19 DIAGNOSIS — F410 Panic disorder [episodic paroxysmal anxiety] without agoraphobia: Secondary | ICD-10-CM | POA: Insufficient documentation

## 2020-11-19 DIAGNOSIS — Z13220 Encounter for screening for lipoid disorders: Secondary | ICD-10-CM | POA: Diagnosis not present

## 2020-11-19 DIAGNOSIS — R232 Flushing: Secondary | ICD-10-CM

## 2020-11-19 HISTORY — DX: Panic disorder (episodic paroxysmal anxiety): F41.0

## 2020-11-19 HISTORY — DX: Hypothyroidism, unspecified: E03.9

## 2020-11-19 HISTORY — DX: Essential (primary) hypertension: I10

## 2020-11-19 HISTORY — DX: Depression, unspecified: F32.A

## 2020-11-19 NOTE — Progress Notes (Signed)
Metrics: Intervention Frequency ACO  Documented Smoking Status Yearly  Screened one or more times in 24 months  Cessation Counseling or  Active cessation medication Past 24 months  Past 24 months   Guideline developer: UpToDate (See UpToDate for funding source) Date Released: 2014       Wellness Office Visit  Subjective:  Patient ID: Alyssa Vincent, female    DOB: [birth year]  Age: 50 y.o. MRN: [id number]  CC: This pleasant 50 year old lady comes to our practice as a new patient to establish care.  She moved from [place] recently and wants to establish with a new primary care provider. HPI  She has a history of hypothyroidism and takes small amount of levothyroxine. She also has a history of gastroesophageal reflux disease.  She takes famotidine for this. She has a tendency towards panic attacks occasionally and has to use Xanax very occasionally. She also has a history of depression and takes Zoloft.  She is currently nor has she been previously suicidal. She is hypertensive and is on losartan. There is no history of coronary artery disease or cerebrovascular disease.  She denies any chest pain, dyspnea, palpitations or limb weakness. She does describe fatigue, constipation, hair loss, dry skin, mental fog. She also seems to describe hot flashes which she has had since the age of 25 when she had a termination of pregnancy currently. Her cycles have been heavy and irregular.  She now has an IUD Mirena in place which seems to regulate her cycles which are very minimal. Past Medical History:  Diagnosis Date  . Anxiety   . Anxiety attack 11/20/2046  . Depression 11/19/2020  . Essential hypertension, benign 11/19/2020  . GERD (gastroesophageal reflux disease)   . Hypertension   . Hypothyroidism (acquired) 11/19/2020  . Thyroid disease    hypothyroidism   Past Surgical History:  Procedure Laterality Date  . BREAST SURGERY Bilateral 2006   Augmentation -saline  . HERNIA REPAIR      Umbilical  . TONSILLECTOMY       Family History  Problem Relation Age of Onset  . GER disease Mother   . Lung cancer Father   . 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   Social History   Tobacco Use  . Smoking status: Never Smoker  . Smokeless tobacco: Never Used  Substance Use Topics  . Alcohol use: Not on file    Comment: occ    Current Meds  Medication Sig  . ALPRAZolam (XANAX) 0.25 MG tablet Take 0.25 mg by mouth daily as needed for anxiety.  . famotidine (PEPCID) 20 MG tablet Take 20 mg by mouth 2 (two) times daily.  . fexofenadine (ALLEGRA) 180 MG tablet Take 180 mg by mouth daily.  Marland Kitchen levonorgestrel (MIRENA) 20 MCG/DAY IUD 1 each by Intrauterine route once.  Marland Kitchen levothyroxine (SYNTHROID) 25 MCG tablet Take 25 mcg by mouth daily before breakfast.  . losartan (COZAAR) 50 MG tablet Take 50 mg by mouth daily.  . montelukast (SINGULAIR) 10 MG tablet Take 10 mg by mouth at bedtime.  . sertraline (ZOLOFT) 50 MG tablet Take 50 mg by mouth daily.       Objective:   Today's Vitals: BP (!) 130/100   Pulse 72   Ht 5\' 2"  (1.575 m)   Wt 200 lb 6.4 oz (90.9 kg)   BMI 36.65 kg/m  Vitals with BMI 11/19/2020  Height 5\' 2"   Weight 200 lbs 6 oz  BMI 31.54  Systolic 008  Diastolic 676  Pulse 72     Physical Exam  She looks systemically well.  She is obese.  Diastolic blood pressure elevated today.  This will be kept on the monitor.  She is alert and orientated without any obvious focal logical signs.     Assessment   1. Hot flashes   2. Essential hypertension, benign   3. Hypothyroidism (acquired)   4. Depression, unspecified depression type   5. Anxiety attack   6. Malaise and fatigue   7. Vitamin D deficiency disease   8. Screening for diabetes mellitus   9. Screening for lipoid disorders       Tests ordered Orders Placed This  Encounter  Procedures  . CBC  . COMPLETE METABOLIC PANEL WITH GFR  . Hemoglobin A1c  . Lipid panel  . T3, free  . T4, free  . TSH  . VITAMIN D 25 Hydroxy (Vit-D Deficiency, Fractures)     Plan: 1. She will continue for the time being with levothyroxine for her hypothyroidism.  I doubt that this is the appropriate medication or dose. 2. Continue with losartan 50 mg daily and we will monitor blood pressure and I have recommended she obtain a blood pressure monitor for her own use. 3. Continue with Zoloft for her depression. 4. Blood work is ordered. 5. I will see her next week to discuss all these results and further recommendations.   No orders of the defined types were placed in this encounter.   Doree Albee, MD

## 2020-11-20 LAB — CBC
HCT: 40.3 % (ref 35.0–45.0)
Hemoglobin: 13.1 g/dL (ref 11.7–15.5)
MCH: 29.8 pg (ref 27.0–33.0)
MCHC: 32.5 g/dL (ref 32.0–36.0)
MCV: 91.6 fL (ref 80.0–100.0)
MPV: 10.3 fL (ref 7.5–12.5)
Platelets: 249 10*3/uL (ref 140–400)
RBC: 4.4 10*6/uL (ref 3.80–5.10)
RDW: 13.1 % (ref 11.0–15.0)
WBC: 9 10*3/uL (ref 3.8–10.8)

## 2020-11-20 LAB — HEMOGLOBIN A1C
Hgb A1c MFr Bld: 5.4 % of total Hgb (ref <5.7)
Mean Plasma Glucose: 108 mg/dL
eAG (mmol/L): 6 mmol/L

## 2020-11-20 LAB — COMPLETE METABOLIC PANEL WITH GFR
AG Ratio: 1.6 (calc) (ref 1.0–2.5)
ALT: 17 U/L (ref 6–29)
AST: 11 U/L (ref 10–35)
Albumin: 4.1 g/dL (ref 3.6–5.1)
Alkaline phosphatase (APISO): 59 U/L (ref 31–125)
BUN: 10 mg/dL (ref 7–25)
CO2: 25 mmol/L (ref 20–32)
Calcium: 8.9 mg/dL (ref 8.6–10.2)
Chloride: 104 mmol/L (ref 98–110)
Creat: 0.64 mg/dL (ref 0.50–1.10)
GFR, Est African American: 122 mL/min/{1.73_m2} (ref >=60)
GFR, Est Non African American: 106 mL/min/{1.73_m2} (ref >=60)
Globulin: 2.5 g/dL (calc) (ref 1.9–3.7)
Glucose, Bld: 92 mg/dL (ref 65–99)
Potassium: 4.1 mmol/L (ref 3.5–5.3)
Sodium: 139 mmol/L (ref 135–146)
Total Bilirubin: 0.7 mg/dL (ref 0.2–1.2)
Total Protein: 6.6 g/dL (ref 6.1–8.1)

## 2020-11-20 LAB — LIPID PANEL
Cholesterol: 208 mg/dL — ABNORMAL HIGH (ref <200)
HDL: 63 mg/dL (ref >=50)
LDL Cholesterol (Calc): 120 mg/dL (calc) — ABNORMAL HIGH
Non-HDL Cholesterol (Calc): 145 mg/dL (calc) — ABNORMAL HIGH (ref <130)
Total CHOL/HDL Ratio: 3.3 (calc) (ref <5.0)
Triglycerides: 139 mg/dL (ref <150)

## 2020-11-20 LAB — VITAMIN D 25 HYDROXY (VIT D DEFICIENCY, FRACTURES): Vit D, 25-Hydroxy: 25 ng/mL — ABNORMAL LOW (ref 30–100)

## 2020-11-20 LAB — T3, FREE: T3, Free: 3.2 pg/mL (ref 2.3–4.2)

## 2020-11-20 LAB — TSH: TSH: 3.86 mIU/L

## 2020-11-20 LAB — T4, FREE: Free T4: 1.1 ng/dL (ref 0.8–1.8)

## 2020-11-30 ENCOUNTER — Encounter: Payer: PRIVATE HEALTH INSURANCE | Attending: Internal Medicine | Primary: Internal Medicine

## 2020-12-13 ENCOUNTER — Ambulatory Visit (INDEPENDENT_AMBULATORY_CARE_PROVIDER_SITE_OTHER): Payer: 59 | Admitting: Internal Medicine

## 2020-12-16 ENCOUNTER — Encounter (INDEPENDENT_AMBULATORY_CARE_PROVIDER_SITE_OTHER): Payer: Self-pay | Admitting: Internal Medicine

## 2020-12-17 ENCOUNTER — Other Ambulatory Visit (INDEPENDENT_AMBULATORY_CARE_PROVIDER_SITE_OTHER): Payer: Self-pay | Admitting: Internal Medicine

## 2020-12-17 MED ORDER — LOSARTAN POTASSIUM 50 MG PO TABS: 50.0000 mg | ORAL_TABLET | Freq: Every day | ORAL | 0 refills | Status: DC

## 2020-12-20 ENCOUNTER — Ambulatory Visit (INDEPENDENT_AMBULATORY_CARE_PROVIDER_SITE_OTHER): Payer: 59 | Admitting: Internal Medicine

## 2020-12-20 ENCOUNTER — Encounter (INDEPENDENT_AMBULATORY_CARE_PROVIDER_SITE_OTHER): Payer: Self-pay | Admitting: Internal Medicine

## 2020-12-20 ENCOUNTER — Other Ambulatory Visit: Payer: Self-pay

## 2020-12-20 VITALS — BP 120/80 | HR 75 | Temp 97.3°F | Resp 18 | Ht 62.0 in | Wt 198.0 lb

## 2020-12-20 DIAGNOSIS — E559 Vitamin D deficiency, unspecified: Secondary | ICD-10-CM | POA: Diagnosis not present

## 2020-12-20 DIAGNOSIS — E039 Hypothyroidism, unspecified: Secondary | ICD-10-CM | POA: Diagnosis not present

## 2020-12-20 DIAGNOSIS — G8929 Other chronic pain: Secondary | ICD-10-CM

## 2020-12-20 DIAGNOSIS — K219 Gastro-esophageal reflux disease without esophagitis: Secondary | ICD-10-CM | POA: Diagnosis not present

## 2020-12-20 DIAGNOSIS — M25562 Pain in left knee: Secondary | ICD-10-CM | POA: Diagnosis not present

## 2020-12-20 DIAGNOSIS — I10 Essential (primary) hypertension: Secondary | ICD-10-CM | POA: Diagnosis not present

## 2020-12-20 MED ORDER — NP THYROID 60 MG PO TABS: 60.0000 mg | ORAL_TABLET | Freq: Every day | ORAL | 3 refills | Status: DC

## 2020-12-20 NOTE — Progress Notes (Signed)
Metrics: Intervention Frequency ACO  Documented Smoking Status Yearly  Screened one or more times in 24 months  Cessation Counseling or  Active cessation medication Past 24 months  Past 24 months   Guideline developer: UpToDate (See UpToDate for funding source) Date Released: 2014       Wellness Office Visit  Subjective:  Patient ID: Alyssa Vincent, female    DOB: 1971/05/13  Age: 50 y.o. MRN: [id number]  CC: This lady comes in for follow-up of hypertension, hypothyroidism and obesity. HPI  We discussed all her blood work from the last visit.  She has significant vitamin D deficiency.  She has suboptimal free T3 levels and TSH is elevated above 2.5.  She continues to take levothyroxine with a small dose and is really not helping her feel better.  She has fatigue, hair loss. She is also describing left knee pain which she has had for about 3 months after a fall which was accidental in a bowling alley. She also has GERD and takes famotidine for this which seems to be controlling it. Past Medical History:  Diagnosis Date   Anxiety    Anxiety attack 11/19/2020   Depression 11/19/2020   Essential hypertension, benign 11/19/2020   GERD (gastroesophageal reflux disease)    Hypertension    Hypothyroidism (acquired) 11/19/2020   Thyroid disease    hypothyroidism   Past Surgical History:  Procedure Laterality Date   BREAST SURGERY Bilateral 2006   Augmentation -saline   HERNIA REPAIR     Umbilical   TONSILLECTOMY       Family History  Problem Relation Age of Onset   GER disease Mother    Lung cancer Father    Anxiety disorder Father     Social History   Social History Narrative   Divorced since 2015,married for 20 years.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   Social History   Tobacco Use   Smoking status: Never   Smokeless tobacco: Never  Substance Use Topics   Alcohol use: Not on file    Comment: occ    Current Meds  Medication Sig   ALPRAZolam (XANAX)  0.25 MG tablet Take 0.25 mg by mouth daily as needed for anxiety.   famotidine (PEPCID) 20 MG tablet Take 20 mg by mouth 2 (two) times daily.   fexofenadine (ALLEGRA) 180 MG tablet Take 180 mg by mouth daily.   levonorgestrel (MIRENA) 20 MCG/DAY IUD 1 each by Intrauterine route once.   levothyroxine (SYNTHROID) 25 MCG tablet Take 25 mcg by mouth daily before breakfast.   losartan (COZAAR) 50 MG tablet Take 1 tablet (50 mg total) by mouth daily.   montelukast (SINGULAIR) 10 MG tablet Take 10 mg by mouth at bedtime.   NP THYROID 60 MG tablet Take 1 tablet (60 mg total) by mouth daily before breakfast.   sertraline (ZOLOFT) 50 MG tablet Take 50 mg by mouth daily.       Objective:   Today's Vitals: BP 120/80 (BP Location: Right Arm, Patient Position: Sitting, Cuff Size: Normal)   Pulse 75   Temp (!) 97.3 F (36.3 C) (Temporal)   Resp 18   Ht 5\' 2"  (1.575 m)   Wt 198 lb (89.8 kg)   SpO2 97%   BMI 36.21 kg/m  Vitals with BMI 12/20/2020 11/19/2020  Height 5\' 2"  5\' 2"   Weight 198 lbs 200 lbs 6 oz  BMI 15.17 61.60  Systolic 737 106  Diastolic 80 269  Pulse 75 72     Physical Exam  She remains obese and she has lost 2 pounds since last visit.  Blood pressure is in a good range.  Examination of the left knee shows significant crepitus.    Assessment   1. Hypothyroidism (acquired)   2. Essential hypertension, benign   3. Chronic pain of left knee   4. Gastroesophageal reflux disease without esophagitis   5. Vitamin D deficiency disease       Tests ordered Orders Placed This Encounter  Procedures   Ambulatory referral to Orthopedic Surgery      Plan: 1.  We discussed treatment of hypothyroidism and I discussed the rationale for changing her medication.  After shared decision making and discussion, she is agreeable to switch to NP thyroid 60 mg daily and I have sent a new prescription.  I have explained possible side effects and how to deal with them. 2.  Continue with  losartan for hypertension which is keeping her blood pressure under good control. 3.  I recommended she start taking vitamin D3 10,000 units daily. 4.  I will refer to orthopedic surgery for her chronic left knee pain. 5.  I do not think she needs referral to GI at the present time for the GERD but we can do that if things do not improve.  She asked about a screening colonoscopy and I told her to ask her insurance company if they will pay for it at her age. 6.  Follow-up with me in about 2 months to see how she is doing and we will probably do some blood work then.    Meds ordered this encounter  Medications   NP THYROID 60 MG tablet    Sig: Take 1 tablet (60 mg total) by mouth daily before breakfast.    Dispense:  30 tablet    Refill:  3     Kalyssa Anker Luther Parody, MD

## 2020-12-20 NOTE — Progress Notes (Signed)
[place] at the [place]. Now it is crunching up.

## 2021-01-01 ENCOUNTER — Encounter (INDEPENDENT_AMBULATORY_CARE_PROVIDER_SITE_OTHER): Payer: Self-pay | Admitting: Internal Medicine

## 2021-01-11 ENCOUNTER — Ambulatory Visit (INDEPENDENT_AMBULATORY_CARE_PROVIDER_SITE_OTHER): Payer: 59 | Admitting: Orthopedic Surgery

## 2021-01-11 ENCOUNTER — Ambulatory Visit: Payer: 59

## 2021-01-11 ENCOUNTER — Other Ambulatory Visit: Payer: Self-pay

## 2021-01-11 ENCOUNTER — Encounter: Payer: Self-pay | Admitting: Orthopedic Surgery

## 2021-01-11 VITALS — BP 140/74 | HR 67 | Ht 62.0 in | Wt 201.0 lb

## 2021-01-11 DIAGNOSIS — S8002XA Contusion of left knee, initial encounter: Secondary | ICD-10-CM | POA: Diagnosis not present

## 2021-01-11 DIAGNOSIS — M25562 Pain in left knee: Secondary | ICD-10-CM

## 2021-01-11 NOTE — Patient Instructions (Signed)
Voltaren gel   Knee Exercises  Ask your health care provider which exercises are safe for you. Do exercises exactly as told by your health care provider and adjust them as directed. It is normal to feel mild stretching, pulling, tightness, or discomfort as you do these exercises. Stop right away if you feel sudden pain or your pain gets worse. Do not begin these exercises until told by your health care provider.  Stretching and range-of-motion exercises These exercises warm up your muscles and joints and improve the movement and flexibility of your knee. These exercises also help to relieve pain and swelling.  Knee extension, prone Lie on your abdomen (prone position) on a bed. Place your left / right knee just beyond the edge of the surface so your knee is not on the bed. You can put a towel under your left / right thigh just above your kneecap for comfort. Relax your leg muscles and allow gravity to straighten your knee (extension). You should feel a stretch behind your left / right knee. Hold this position for 10 seconds. Scoot up so your knee is supported between repetitions. Repeat 10 times. Complete this exercise 3-4 times per week.     Knee flexion, active Lie on your back with both legs straight. If this causes back discomfort, bend your left / right knee so your foot is flat on the floor. Slowly slide your left / right heel back toward your buttocks. Stop when you feel a gentle stretch in the front of your knee or thigh (flexion). Hold this position for 10 seconds. Slowly slide your left / right heel back to the starting position. Repeat 10 times. Complete this exercise 3-4 times per week.      Quadriceps stretch, prone Lie on your abdomen on a firm surface, such as a bed or padded floor. Bend your left / right knee and hold your ankle. If you cannot reach your ankle or pant leg, loop a belt around your foot and grab the belt instead. Gently pull your heel toward your buttocks.  Your knee should not slide out to the side. You should feel a stretch in the front of your thigh and knee (quadriceps). Hold this position for 10 seconds. Repeat 10 times. Complete this exercise 3-4 times per week.      Hamstring, supine Lie on your back (supine position). Loop a belt or towel over the ball of your left / right foot. The ball of your foot is on the walking surface, right under your toes. Straighten your left / right knee and slowly pull on the belt to raise your leg until you feel a gentle stretch behind your knee (hamstring). Do not let your knee bend while you do this. Keep your other leg flat on the floor. Hold this position for 10 seconds. Repeat 10 times. Complete this exercise 3-4 times per week.   Strengthening exercises These exercises build strength and endurance in your knee. Endurance is the ability to use your muscles for a long time, even after they get tired.  Quadriceps, isometric This exercise stretches the muscles in front of your thigh (quadriceps) without moving your knee joint (isometric). Lie on your back with your left / right leg extended and your other knee bent. Put a rolled towel or small pillow under your knee if told by your health care provider. Slowly tense the muscles in the front of your left / right thigh. You should see your kneecap slide up toward your hip  or see increased dimpling just above the knee. This motion will push the back of the knee toward the floor. For 10 seconds, hold the muscle as tight as you can without increasing your pain. Relax the muscles slowly and completely. Repeat 10 times. Complete this exercise 3-4 times per week. .     Straight leg raises This exercise stretches the muscles in front of your thigh (quadriceps) and the muscles that move your hips (hip flexors). Lie on your back with your left / right leg extended and your other knee bent. Tense the muscles in the front of your left / right thigh. You should  see your kneecap slide up or see increased dimpling just above the knee. Your thigh may even shake a bit. Keep these muscles tight as you raise your leg 4-6 inches (10-15 cm) off the floor. Do not let your knee bend. Hold this position for 10 seconds. Keep these muscles tense as you lower your leg. Relax your muscles slowly and completely after each repetition. Repeat 10 times. Complete this exercise 3-4 times per week.  Hamstring, isometric Lie on your back on a firm surface. Bend your left / right knee about 30 degrees. Dig your left / right heel into the surface as if you are trying to pull it toward your buttocks. Tighten the muscles in the back of your thighs (hamstring) to "dig" as hard as you can without increasing any pain. Hold this position for 10 seconds. Release the tension gradually and allow your muscles to relax completely for __________ seconds after each repetition. Repeat 10 times. Complete this exercise 3-4 times per week.  Hamstring curls If told by your health care provider, do this exercise while wearing ankle weights. Begin with 5 lb weights. Then increase the weight by 1 lb (0.5 kg) increments. You can also use an exercise band Lie on your abdomen with your legs straight. Bend your left / right knee as far as you can without feeling pain. Keep your hips flat against the floor. Hold this position for 10 seconds. Slowly lower your leg to the starting position. Repeat 10 times. Complete this exercise 3-4 times per week.      Squats This exercise strengthens the muscles in front of your thigh and knee (quadriceps). Stand in front of a table, with your feet and knees pointing straight ahead. You may rest your hands on the table for balance but not for support. Slowly bend your knees and lower your hips like you are going to sit in a chair. Keep your weight over your heels, not over your toes. Keep your lower legs upright so they are parallel with the table legs. Do  not let your hips go lower than your knees. Do not bend lower than told by your health care provider. If your knee pain increases, do not bend as low. Hold the squat position for 10 seconds. Slowly push with your legs to return to standing. Do not use your hands to pull yourself to standing. Repeat 10 times. Complete this exercise 3-4 times per week .     Wall slides This exercise strengthens the muscles in front of your thigh and knee (quadriceps). Lean your back against a smooth wall or door, and walk your feet out 18-24 inches (46-61 cm) from it. Place your feet hip-width apart. Slowly slide down the wall or door until your knees bend 90 degrees. Keep your knees over your heels, not over your toes. Keep your knees in line  with your hips. Hold this position for 10 seconds. Repeat 10 times. Complete this exercise 3-4 times per week.      Straight leg raises This exercise strengthens the muscles that rotate the leg at the hip and move it away from your body (hip abductors). Lie on your side with your left / right leg in the top position. Lie so your head, shoulder, knee, and hip line up. You may bend your bottom knee to help you keep your balance. Roll your hips slightly forward so your hips are stacked directly over each other and your left / right knee is facing forward. Leading with your heel, lift your top leg 4-6 inches (10-15 cm). You should feel the muscles in your outer hip lifting. Do not let your foot drift forward. Do not let your knee roll toward the ceiling. Hold this position for 10 seconds. Slowly return your leg to the starting position. Let your muscles relax completely after each repetition. Repeat 10 times. Complete this exercise 3-4 times per week.      Straight leg raises This exercise stretches the muscles that move your hips away from the front of the pelvis (hip extensors). Lie on your abdomen on a firm surface. You can put a pillow under your hips if that is  more comfortable. Tense the muscles in your buttocks and lift your left / right leg about 4-6 inches (10-15 cm). Keep your knee straight as you lift your leg. Hold this position for 10 seconds. Slowly lower your leg to the starting position. Let your leg relax completely after each repetition. Repeat 10 times. Complete this exercise 3-4 times per week.

## 2021-01-11 NOTE — Progress Notes (Signed)
New Patient Visit  Assessment: Alyssa Vincent is a 50 y.o. female with the following: 1. Contusion of left knee, initial encounter; less likely pes anserine bursitis  Plan: Alyssa Vincent sustained an injury in March, 2022 after direct impact over the front of the knee.  At this point, she continues to have tenderness to palpation, and pain is primarily exhibited with direct contact.  X-rays were reviewed in clinic today, which demonstrates minimal signs of arthritis, without an acute injury.  Based on her current presentation, I feel the most likely diagnosis is a contusion, possible bone bruising and less likely Pes anserine bursitis.  She does not need to limit her activities.  If she is having difficulty getting into positions during yoga, she can try a knee pad.  I have also recommended Voltaren topical gel, and provided her with some home exercises.  If she continues to have issues, we could consider a steroid injection at the site of maximal tenderness.  All questions were answered, and she is amenable to this plan.   Follow-up: Return if symptoms worsen or fail to improve.  Subjective:  Chief Complaint  Patient presents with   Knee Pain    Lt knee pain after a fall on 09/08/20. Pt states she has iced it when it was swollen and taken Advil for pain. Pt states she's unable to put any pressure on the knee at this time.     History of Present Illness: Alyssa Vincent is a 50 y.o. female who has been referred to clinic today by Hurshel Party, MD for evaluation of left knee pain.  She states that she slipped and fell, at a bowling alley, and landed directly on the front of her knee.  She had bruising and tenderness at that time.  This is persisted.  She continues to have difficulty with direct contact.  She does not note any instability.  She does feel as though the knee is swollen a little bit.  It bothers her with some activities, specifically yoga, when she is trying to get into a kneeling position.   She is been taking Advil, and icing her left knee.  This helps, but has not alleviated her pain.  No physical therapy.  She is never had an injury to this knee before.  She does notice some grinding in her knee, but this is not painful.   Review of Systems: No fevers or chills No numbness or tingling No chest pain No shortness of breath No bowel or bladder dysfunction No GI distress No headaches   Medical History:  Past Medical History:  Diagnosis Date   Anxiety    Anxiety attack 11/19/2020   Depression 11/19/2020   Essential hypertension, benign 11/19/2020   GERD (gastroesophageal reflux disease)    Hypertension    Hypothyroidism (acquired) 11/19/2020   Thyroid disease    hypothyroidism    Past Surgical History:  Procedure Laterality Date   BREAST SURGERY Bilateral 2006   Augmentation -saline   HERNIA REPAIR     Umbilical   TONSILLECTOMY      Family History  Problem Relation Age of Onset   GER disease Mother    Lung cancer Father    Anxiety disorder Father    Social History   Tobacco Use   Smoking status: Never   Smokeless tobacco: Never  Substance Use Topics   Drug use: Never    Allergies  Allergen Reactions   Penicillins Diarrhea, Hives, Nausea Only and Swelling   Codeine Hives,  Itching, Rash and Swelling    Current Meds  Medication Sig   ALPRAZolam (XANAX) 0.25 MG tablet Take 0.25 mg by mouth daily as needed for anxiety.   famotidine (PEPCID) 20 MG tablet Take 20 mg by mouth 2 (two) times daily.   fexofenadine (ALLEGRA) 180 MG tablet Take 180 mg by mouth daily.   levonorgestrel (MIRENA) 20 MCG/DAY IUD 1 each by Intrauterine route once.   losartan (COZAAR) 50 MG tablet Take 1 tablet (50 mg total) by mouth daily.   montelukast (SINGULAIR) 10 MG tablet Take 10 mg by mouth at bedtime.   NP THYROID 60 MG tablet Take 1 tablet (60 mg total) by mouth daily before breakfast.   sertraline (ZOLOFT) 50 MG tablet Take 50 mg by mouth daily.    Objective: BP  140/74   Pulse 67   Ht 5\' 2"  (1.575 m)   Wt 201 lb (91.2 kg)   BMI 36.76 kg/m   Physical Exam:  General: Alert and oriented. and No acute distress. Gait: Normal gait.  Evaluation left knee demonstrates no swelling.  No deformity is appreciated.  No ecchymosis.  She has tenderness to palpation over the anterior medial aspect of her knee, just distal to the joint line.  No tenderness to palpation of the tibial tubercle.  No tenderness to palpation over the medial joint line.  No tenderness to palpation over the lateral joint line.  Negative Lachman.  No increased laxity varus valgus stress.  She has full and painless range of motion, with some crepitus.  IMAGING: I personally ordered and reviewed the following images  X-rays of the left knee were obtained in clinic today and demonstrates neutral overall alignment.  Well-maintained joint space within the medial lateral compartments.  The patella is aligned well within the trochlea, although there does appear to be some small osteophytes in the periphery of the patella.  Also, the views obtained do not fully demonstrate the patellofemoral compartment.  Impression: Normal left knee x-ray; possible mild degenerative changes within the patellofemoral compartment.   New Medications:  No orders of the defined types were placed in this encounter.     Mordecai Rasmussen, MD  01/11/2021 9:11 AM

## 2021-01-24 ENCOUNTER — Encounter (INDEPENDENT_AMBULATORY_CARE_PROVIDER_SITE_OTHER): Payer: Self-pay | Admitting: Internal Medicine

## 2021-01-25 ENCOUNTER — Other Ambulatory Visit (INDEPENDENT_AMBULATORY_CARE_PROVIDER_SITE_OTHER): Payer: Self-pay | Admitting: Nurse Practitioner

## 2021-01-25 MED ORDER — SERTRALINE HCL 50 MG PO TABS: 50.0000 mg | ORAL_TABLET | Freq: Every day | ORAL | 0 refills | Status: DC

## 2021-02-20 ENCOUNTER — Other Ambulatory Visit (INDEPENDENT_AMBULATORY_CARE_PROVIDER_SITE_OTHER): Payer: Self-pay | Admitting: Nurse Practitioner

## 2021-02-20 DIAGNOSIS — E039 Hypothyroidism, unspecified: Secondary | ICD-10-CM

## 2021-02-20 MED ORDER — NP THYROID 60 MG PO TABS: 60.0000 mg | ORAL_TABLET | Freq: Every day | ORAL | 0 refills | Status: DC

## 2021-02-21 ENCOUNTER — Ambulatory Visit (INDEPENDENT_AMBULATORY_CARE_PROVIDER_SITE_OTHER): Payer: 59 | Admitting: Internal Medicine

## 2021-03-06 ENCOUNTER — Other Ambulatory Visit: Payer: Self-pay

## 2021-03-06 ENCOUNTER — Encounter: Payer: Self-pay | Admitting: Emergency Medicine

## 2021-03-06 ENCOUNTER — Emergency Department: Payer: 59

## 2021-03-06 ENCOUNTER — Emergency Department
Admission: EM | Admit: 2021-03-06 | Discharge: 2021-03-06 | Disposition: A | Discharge status: Home / Self Care | Payer: 59 | Attending: Emergency Medicine | Admitting: Emergency Medicine

## 2021-03-06 DIAGNOSIS — Z041 Encounter for examination and observation following transport accident: Secondary | ICD-10-CM | POA: Diagnosis not present

## 2021-03-06 DIAGNOSIS — S8992XA Unspecified injury of left lower leg, initial encounter: Secondary | ICD-10-CM | POA: Diagnosis present

## 2021-03-06 DIAGNOSIS — S8002XA Contusion of left knee, initial encounter: Secondary | ICD-10-CM | POA: Diagnosis not present

## 2021-03-06 DIAGNOSIS — M25562 Pain in left knee: Secondary | ICD-10-CM | POA: Diagnosis not present

## 2021-03-06 DIAGNOSIS — G44309 Post-traumatic headache, unspecified, not intractable: Secondary | ICD-10-CM | POA: Diagnosis not present

## 2021-03-06 DIAGNOSIS — G44319 Acute post-traumatic headache, not intractable: Secondary | ICD-10-CM | POA: Insufficient documentation

## 2021-03-06 DIAGNOSIS — Z79899 Other long term (current) drug therapy: Secondary | ICD-10-CM | POA: Diagnosis not present

## 2021-03-06 DIAGNOSIS — I10 Essential (primary) hypertension: Secondary | ICD-10-CM | POA: Insufficient documentation

## 2021-03-06 DIAGNOSIS — E039 Hypothyroidism, unspecified: Secondary | ICD-10-CM | POA: Insufficient documentation

## 2021-03-06 DIAGNOSIS — Y92410 Unspecified street and highway as the place of occurrence of the external cause: Secondary | ICD-10-CM | POA: Insufficient documentation

## 2021-03-06 DIAGNOSIS — S161XXA Strain of muscle, fascia and tendon at neck level, initial encounter: Secondary | ICD-10-CM

## 2021-03-06 MED ORDER — METHOCARBAMOL 500 MG PO TABS: 500.0000 mg | ORAL_TABLET | Freq: Four times a day (QID) | ORAL | 0 refills | Status: DC

## 2021-03-06 MED ORDER — MELOXICAM 15 MG PO TABS: 15.0000 mg | ORAL_TABLET | Freq: Every day | ORAL | 0 refills | Status: DC
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
## 2021-03-15 ENCOUNTER — Encounter: Payer: Self-pay | Admitting: Nurse Practitioner

## 2021-03-15 ENCOUNTER — Other Ambulatory Visit: Payer: Self-pay

## 2021-03-15 ENCOUNTER — Ambulatory Visit (INDEPENDENT_AMBULATORY_CARE_PROVIDER_SITE_OTHER): Payer: 59

## 2021-03-15 ENCOUNTER — Telehealth: Payer: Self-pay

## 2021-03-15 ENCOUNTER — Ambulatory Visit: Payer: 59 | Admitting: Nurse Practitioner

## 2021-03-15 VITALS — BP 120/86 | HR 81 | Temp 98.3°F | Ht 62.0 in | Wt 200.0 lb

## 2021-03-15 DIAGNOSIS — R059 Cough, unspecified: Secondary | ICD-10-CM

## 2021-03-15 DIAGNOSIS — R053 Chronic cough: Secondary | ICD-10-CM | POA: Diagnosis not present

## 2021-03-15 DIAGNOSIS — I10 Essential (primary) hypertension: Secondary | ICD-10-CM | POA: Diagnosis not present

## 2021-03-15 DIAGNOSIS — M25562 Pain in left knee: Secondary | ICD-10-CM

## 2021-03-15 DIAGNOSIS — R251 Tremor, unspecified: Secondary | ICD-10-CM

## 2021-03-15 DIAGNOSIS — M542 Cervicalgia: Secondary | ICD-10-CM | POA: Diagnosis not present

## 2021-03-15 DIAGNOSIS — R519 Headache, unspecified: Secondary | ICD-10-CM | POA: Diagnosis not present

## 2021-03-15 DIAGNOSIS — R079 Chest pain, unspecified: Secondary | ICD-10-CM | POA: Diagnosis not present

## 2021-03-15 DIAGNOSIS — R072 Precordial pain: Secondary | ICD-10-CM | POA: Diagnosis not present

## 2021-03-15 MED ORDER — LOSARTAN POTASSIUM 50 MG PO TABS: 50.0000 mg | ORAL_TABLET | Freq: Every day | ORAL | 1 refills | Status: DC
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
## 2021-03-15 NOTE — Telephone Encounter (Signed)
Please email work note to [email or web address]

## 2021-03-18 ENCOUNTER — Other Ambulatory Visit: Payer: Self-pay

## 2021-03-18 NOTE — Telephone Encounter (Signed)
Sent letter via email.
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
## 2021-03-21 ENCOUNTER — Ambulatory Visit: Payer: 59 | Admitting: Family Medicine

## 2021-03-21 ENCOUNTER — Other Ambulatory Visit: Payer: Self-pay | Admitting: Nurse Practitioner

## 2021-03-21 ENCOUNTER — Other Ambulatory Visit: Payer: Self-pay

## 2021-03-21 ENCOUNTER — Ambulatory Visit: Payer: Self-pay

## 2021-03-21 ENCOUNTER — Encounter: Payer: Self-pay | Admitting: Family Medicine

## 2021-03-21 VITALS — BP 110/82 | HR 77 | Ht 62.0 in | Wt 200.0 lb

## 2021-03-21 DIAGNOSIS — M4312 Spondylolisthesis, cervical region: Secondary | ICD-10-CM

## 2021-03-21 DIAGNOSIS — R251 Tremor, unspecified: Secondary | ICD-10-CM

## 2021-03-21 DIAGNOSIS — M25562 Pain in left knee: Secondary | ICD-10-CM

## 2021-03-21 DIAGNOSIS — M222X2 Patellofemoral disorders, left knee: Secondary | ICD-10-CM | POA: Diagnosis not present

## 2021-03-21 MED ORDER — ALPRAZOLAM 0.25 MG PO TABS: 0.2500 mg | ORAL_TABLET | Freq: Every day | ORAL | 0 refills | Status: DC | PRN

## 2021-03-21 NOTE — Patient Instructions (Addendum)
Nice to meet you today.  You had a L knee injection.  Call or go to the ER if you develop a large red swollen joint with extreme pain or oozing puss.   I've referred you to PT.  Let me know if you haven't heard from them in one week regarding scheduling.  Please use Voltaren gel (Generic Diclofenac Gel) up to 4x daily for pain as needed.  This is available over-the-counter as both the name brand Voltaren gel and the generic diclofenac gel.   Follow-up in 6 weeks.  Let me know if not better and I'll order the MRI.

## 2021-03-28 DIAGNOSIS — Z6835 Body mass index (BMI) 35.0-35.9, adult: Secondary | ICD-10-CM | POA: Diagnosis not present

## 2021-03-28 DIAGNOSIS — M5412 Radiculopathy, cervical region: Secondary | ICD-10-CM | POA: Diagnosis not present

## 2021-03-28 DIAGNOSIS — M542 Cervicalgia: Secondary | ICD-10-CM | POA: Diagnosis not present

## 2021-03-28 DIAGNOSIS — I10 Essential (primary) hypertension: Secondary | ICD-10-CM | POA: Diagnosis not present

## 2021-04-10 ENCOUNTER — Other Ambulatory Visit: Payer: Self-pay

## 2021-04-10 ENCOUNTER — Ambulatory Visit: Payer: 59 | Attending: Student

## 2021-04-10 DIAGNOSIS — M436 Torticollis: Secondary | ICD-10-CM | POA: Insufficient documentation

## 2021-04-10 DIAGNOSIS — M25512 Pain in left shoulder: Secondary | ICD-10-CM | POA: Insufficient documentation

## 2021-04-10 DIAGNOSIS — M542 Cervicalgia: Secondary | ICD-10-CM | POA: Diagnosis not present

## 2021-04-10 DIAGNOSIS — M6281 Muscle weakness (generalized): Secondary | ICD-10-CM | POA: Diagnosis not present
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
## 2021-04-10 NOTE — Patient Instructions (Signed)
  [id number]

## 2021-04-11 ENCOUNTER — Telehealth: Payer: Self-pay | Admitting: Nurse Practitioner

## 2021-04-11 NOTE — Telephone Encounter (Signed)
Patient requesting FMLA papers to be filled out.  Patient states employer requires flu vaccine, patient had flu like reaction to vaccine in past, patient request a doctors note for employer to exempt her from taking vaccine

## 2021-04-12 NOTE — Telephone Encounter (Signed)
Type of form received:  FMLA forms  Flu shot exemption  Form placed in Provider mailbox  Additional instructions from the patient (mail, fax, notify by phone when complete)  Please call the patient with next steps at [phone]. The patient stated that they had talked with Elyn Aquas during their initial visit about this paperwork.  Advised patient I would place forms in the providers box for someone to complete in her absence, but she may be called in for an appointment to go over  Flu shot exemption paperwork needed by 10.31.22  Patient informed paperwork may take up to 7-10 business days

## 2021-04-16 NOTE — Telephone Encounter (Signed)
See below

## 2021-04-16 NOTE — Telephone Encounter (Signed)
In general we only do exemptions for vaccines for allergic reactions (typically including anaphylaxis with vaccines or severe swelling in the arm). A flu like illness can be a normal reaction with the flu shot so I would not be comfortable for medical exemption for that reason. Is the FMLA separate and if so what is the medical condition and request on amount of time (continuous versus intermittent and days or weeks requested)?

## 2021-04-17 ENCOUNTER — Other Ambulatory Visit: Payer: Self-pay

## 2021-04-17 ENCOUNTER — Ambulatory Visit: Payer: 59

## 2021-04-17 DIAGNOSIS — M436 Torticollis: Secondary | ICD-10-CM | POA: Diagnosis not present

## 2021-04-17 DIAGNOSIS — M6281 Muscle weakness (generalized): Secondary | ICD-10-CM | POA: Diagnosis not present

## 2021-04-17 DIAGNOSIS — M542 Cervicalgia: Secondary | ICD-10-CM | POA: Diagnosis not present

## 2021-04-17 DIAGNOSIS — M25512 Pain in left shoulder: Secondary | ICD-10-CM

## 2021-04-17 NOTE — Telephone Encounter (Signed)
Called patient to discuss her FMLA. LVM asking her to give our office a call. Office number was provided.

## 2021-04-17 NOTE — Therapy (Signed)
[place], [place], [address] Phone: [phone]   Fax:  [phone]  Physical Therapy Treatment  Patient Details  Name: Alyssa Vincent MRN: [id number] Date of Birth: [birth year] Referring Provider (PT): Patricia Nettle, NP   Encounter Date: 04/17/2021   PT End of Session - 04/17/21 1750     Visit Number 2    Number of Visits 17    Date for PT Re-Evaluation 06/05/21    Authorization Type [employer] VL 60    Authorization Time Period FOTO v6, v10    PT Start Time 8250    PT Stop Time 5397    PT Time Calculation (min) 45 min    Activity Tolerance Patient tolerated treatment well    Behavior During Therapy [facility] for tasks assessed/performed             Past Medical History:  Diagnosis Date   Anxiety    Anxiety attack 11/19/2020   Depression 11/19/2020   Essential hypertension, benign 11/19/2020   GERD (gastroesophageal reflux disease)    Hypertension    Hypothyroidism (acquired) 11/19/2020   Thyroid disease    hypothyroidism    Past Surgical History:  Procedure Laterality Date   BREAST SURGERY Bilateral 2006   Augmentation -saline   HERNIA REPAIR     Umbilical   TONSILLECTOMY      There were no vitals filed for this visit.   Subjective Assessment - 04/17/21 1741     Subjective Pt reports feeling increased cervical pain today, although she also reports not doing her exercises since her initial eval.    Currently in Pain? Yes    Pain Score 8     Pain Location Neck    Pain Orientation Lower;Mid    Pain Descriptors / Indicators Sore;Tender               Therapeutic Exercise: -Upper trap stretch 2x30sec BIL -Isotonic cervical flexion x10 with pt handhold resistance -Isotonic BIL cervical side flexion x10 with pt handhold resistance -Isotonic cervical extension x10 with pt handhold resistance -Low rows with 20# cable and chin tuck hold 2x10 -High rows with 20# cable and chin tuck hold  2x10 -Lat pulldown with 20# cable and chin tuck hold 2x10 -Supine DNF endurance training (chin tuck with head lift) 3x to exhaustion -Alternating low trap lift off with physioball at wall 2x10 BIL    Manual Therapy: -Contract/ relax PNF technique into cervical rotation x5 BIL                           PT Short Term Goals - 04/10/21 1808       PT SHORT TERM GOAL #1   Title Pt will report understanding and adherence to her HEP in order to promote independence in the management of her primary sxs.    Baseline HEP provided at eval.    Time 4    Period Weeks    Status New    Target Date 05/08/21               PT Long Term Goals - 04/10/21 1918       PT LONG TERM GOAL #1   Title Pt will achieve 62% FOTO score in order to demonstrate improved functional ability as it relates to her neck pain.    Baseline 46%    Time 8    Period Weeks    Status New  Target Date 06/05/21      PT LONG TERM GOAL #2   Title Pt will achieve BIL cervical rotation of 65 degrees or greater with 0-4/10 pain in order to turn her head when driving without limitation.    Baseline See flowsheet    Time 8    Period Weeks    Status New    Target Date 06/05/21      PT LONG TERM GOAL #3   Title Pt will achieve WNL BIL shoulder elevation AROM with 0-4/10 pain in order to reach into cabinets without limitation.    Baseline See flowsheet    Time 8    Period Weeks    Status New    Target Date 06/05/21      PT LONG TERM GOAL #4   Title Pt will achieve BIL global parascapular strength of 4+/5 in order to promote long-term healthy cervical/ scapular positioning.    Baseline 3+/5    Time 8    Period Weeks    Status New    Target Date 06/05/21      PT LONG TERM GOAL #5   Title Pt will achieve cervical flexion and extension AROM of 35 degrees or greater in order to get dressed without limitation.    Baseline 25 degrees each    Time 8    Period Weeks    Status New    Target  Date 06/05/21                   Plan - 04/17/21 1752     Clinical Impression Statement Pt responded well to all interventions today, demonstrating good form and no increase in pain with selected exercises. Of note, the pt demonstrates visually improved BIL cervical rotation AROM following contract/ relax PNF technique. Although she reports, pain immediately following the technique, her pain returned to baseline after about a 1 minute rest. She will continue to benefit from skilled PT to address her primary impairments and return to her prior level of function with less limitation.    Personal Factors and Comorbidities Comorbidity 3+    Comorbidities hx of anxiety, depression, HTN, hyperlipidemia    Examination-Activity Limitations Bathing;Reach Overhead;Sleep;Lift    Examination-Participation Restrictions Cleaning;Meal Prep;Community Activity;Laundry    Stability/Clinical Decision Making Evolving/Moderate complexity    Clinical Decision Making Moderate    Rehab Potential Good    PT Frequency 2x / week    PT Duration 8 weeks    PT Treatment/Interventions ADLs/Self Care Home Management;Moist Heat;Biofeedback;Cryotherapy;Neuromuscular re-education;Therapeutic exercise;Therapeutic activities;Balance training;Functional mobility training;Stair training;Gait training;Taping;Manual techniques;Dry needling;Passive range of motion;Patient/family education    PT Next Visit Plan Progress DNF/ scapular strengthening, gentle cervical AROM/ introduction of cervical concentric strengthening    PT Home Exercise Plan [id number]    Consulted and Agree with Plan of Care Patient             Patient will benefit from skilled therapeutic intervention in order to improve the following deficits and impairments:  Decreased range of motion, Impaired UE functional use, Hypermobility, Pain, Hypomobility, Impaired flexibility, Decreased mobility, Decreased strength  Visit Diagnosis: Cervicalgia  Stiffness  of cervical spine  Acute pain of left shoulder  Muscle weakness (generalized)     Problem List Patient Active Problem List   Diagnosis Date Noted   Essential hypertension, benign 11/19/2020   Hypothyroidism (acquired) 11/19/2020   Depression 11/19/2020   Anxiety attack 11/19/2020    Vanessa [place], PT, DPT 04/17/21 6:25 PM   [place]  [place], [place], [address] Phone: [phone]   Fax:  [phone]  Name: Hurley Blevins MRN: [id number] Date of Birth: 1970/07/02

## 2021-04-22 ENCOUNTER — Encounter

## 2021-04-22 NOTE — Telephone Encounter (Signed)
Please contact the patient to schedule a follow-up appointment

## 2021-04-23 ENCOUNTER — Ambulatory Visit: Payer: 59

## 2021-04-23 ENCOUNTER — Other Ambulatory Visit (INDEPENDENT_AMBULATORY_CARE_PROVIDER_SITE_OTHER): Payer: Self-pay | Admitting: Nurse Practitioner

## 2021-04-23 ENCOUNTER — Other Ambulatory Visit: Payer: Self-pay

## 2021-04-23 DIAGNOSIS — M542 Cervicalgia: Secondary | ICD-10-CM | POA: Diagnosis not present

## 2021-04-23 DIAGNOSIS — M436 Torticollis: Secondary | ICD-10-CM | POA: Diagnosis not present

## 2021-04-23 DIAGNOSIS — M6281 Muscle weakness (generalized): Secondary | ICD-10-CM | POA: Diagnosis not present

## 2021-04-23 DIAGNOSIS — M25512 Pain in left shoulder: Secondary | ICD-10-CM

## 2021-04-23 NOTE — Telephone Encounter (Signed)
Patient states she does not need a refill. She states that she has moved to [facility]. Asked patient if she will be coming here any longer and she said she would not.

## 2021-04-23 NOTE — Therapy (Signed)
[place], [place], [address] Phone: [phone]   Fax:  [phone]  Physical Therapy Treatment  Patient Details  Name: Alyssa Vincent MRN: [id number] Date of Birth: 01-28-1971 Referring Provider (PT): Viona Gilmore D, NP   Encounter Date: 04/23/2021   PT End of Session - 04/23/21 1808     Visit Number 3    Number of Visits 17    Date for PT Re-Evaluation 06/05/21    Authorization Type [employer] VL [id number]    Authorization Time Period FOTO v6, v10    PT Start Time 1745    PT Stop Time 1830   2 minutes dry needle insertion   PT Time Calculation (min) 45 min    Activity Tolerance Patient tolerated treatment well    Behavior During Therapy [facility] for tasks assessed/performed             Past Medical History:  Diagnosis Date   Anxiety    Anxiety attack 11/19/2020   Depression 11/19/2020   Essential hypertension, benign 11/19/2020   GERD (gastroesophageal reflux disease)    Hypertension    Hypothyroidism (acquired) 11/19/2020   Thyroid disease    hypothyroidism    Past Surgical History:  Procedure Laterality Date   BREAST SURGERY Bilateral 2006   Augmentation -saline   HERNIA REPAIR     Umbilical   TONSILLECTOMY      There were no vitals filed for this visit.   Subjective Assessment - 04/23/21 1747     Subjective Pt reports that she has been doing her exercises daily, adding that they have been helpful.    Currently in Pain? Yes    Pain Score 7     Pain Location Neck    Pain Orientation Mid;Lower    Pain Descriptors / Indicators Tender;Sore    Pain Type Chronic pain                [facility] Adult PT Treatment/Exercise:  Therapeutic Exercise: - lower trap lift-offs with yellow theraband -Seated low rows with 25# cable 3x10 -Seated high rows with 25# cable 3x10 -Seated lat pull-down with 25# cable 3x10 -Standing BIL scapular protraction/ retraction with red theraband 3x10  Manual  Therapy: -STM to BIL cervical paraspinals following TPDN  Neuromuscular re-ed: - N/A  Therapeutic Activity: - N/A  Self-care/Home Management: - See patient education/ instructions                  Trigger Point Dry Needling - 04/23/21 0001     Consent Given? Yes    Education Handout Provided Yes    Muscles Treated Head and Neck Cervical multifidi;Splenius capitus   BIL   Splenius capitus Response Palpable increased muscle length;Twitch reponse elicited    Cervical multifidi Response Palpable increased muscle length                   PT Education - 04/23/21 1807     Education Details Pt educated on efficacy, normal response, and common side effects of TPDN. Provided printout with information; see pt instructions.    Person(s) Educated Patient    Methods Explanation;Handout    Comprehension Verbalized understanding              PT Short Term Goals - 04/10/21 1808       PT SHORT TERM GOAL #1   Title Pt will report understanding and adherence to her HEP in order to promote independence in the management of her primary  sxs.    Baseline HEP provided at eval.    Time 4    Period Weeks    Status New    Target Date 05/08/21               PT Long Term Goals - 04/10/21 1918       PT LONG TERM GOAL #1   Title Pt will achieve 62% FOTO score in order to demonstrate improved functional ability as it relates to her neck pain.    Baseline 46%    Time 8    Period Weeks    Status New    Target Date 06/05/21      PT LONG TERM GOAL #2   Title Pt will achieve BIL cervical rotation of 65 degrees or greater with 0-4/10 pain in order to turn her head when driving without limitation.    Baseline See flowsheet    Time 8    Period Weeks    Status New    Target Date 06/05/21      PT LONG TERM GOAL #3   Title Pt will achieve WNL BIL shoulder elevation AROM with 0-4/10 pain in order to reach into cabinets without limitation.    Baseline See flowsheet     Time 8    Period Weeks    Status New    Target Date 06/05/21      PT LONG TERM GOAL #4   Title Pt will achieve BIL global parascapular strength of 4+/5 in order to promote long-term healthy cervical/ scapular positioning.    Baseline 3+/5    Time 8    Period Weeks    Status New    Target Date 06/05/21      PT LONG TERM GOAL #5   Title Pt will achieve cervical flexion and extension AROM of 35 degrees or greater in order to get dressed without limitation.    Baseline 25 degrees each    Time 8    Period Weeks    Status New    Target Date 06/05/21                   Plan - 04/23/21 1822     Clinical Impression Statement Pt responded well to all interventions today, demonstrating good form and no increase in pain with selected exercises. She demonstrates visually improved functional cervical rotation AROM following TPDN to BIL splenius capitus and cervical multifidi and reports feeling much "looser" following this intervention. She also continues to demonstrate improved functional baseline pain and scapular strength, demonstrating the ability to progress previously prescribed exercises. She will continue to benefit from skilled PT to address her primary impairments and return to her prior level of function with less limitation.    Personal Factors and Comorbidities Comorbidity 3+    Comorbidities hx of anxiety, depression, HTN, hyperlipidemia    Examination-Activity Limitations Bathing;Reach Overhead;Sleep;Lift    Examination-Participation Restrictions Cleaning;Meal Prep;Community Activity;Laundry    Stability/Clinical Decision Making Evolving/Moderate complexity    Clinical Decision Making Moderate    Rehab Potential Good    PT Frequency 2x / week    PT Duration 8 weeks    PT Treatment/Interventions ADLs/Self Care Home Management;Moist Heat;Biofeedback;Cryotherapy;Neuromuscular re-education;Therapeutic exercise;Therapeutic activities;Balance training;Functional mobility  training;Stair training;Gait training;Taping;Manual techniques;Dry needling;Passive range of motion;Patient/family education    PT Next Visit Plan Progress DNF/ scapular strengthening, gentle cervical AROM/ introduction of cervical concentric strengthening, assess response to [facility]    PT Home Exercise Plan [id number]    Consulted and  Agree with Plan of Care Patient             Patient will benefit from skilled therapeutic intervention in order to improve the following deficits and impairments:  Decreased range of motion, Impaired UE functional use, Hypermobility, Pain, Hypomobility, Impaired flexibility, Decreased mobility, Decreased strength  Visit Diagnosis: Cervicalgia  Stiffness of cervical spine  Acute pain of left shoulder  Muscle weakness (generalized)     Problem List Patient Active Problem List   Diagnosis Date Noted   Essential hypertension, benign 11/19/2020   Hypothyroidism (acquired) 11/19/2020   Depression 11/19/2020   Anxiety attack 11/19/2020    Vanessa [place], PT, DPT 04/23/21 6:30 PM   [facility] [address] [place], [place], [address] Phone: [phone]   Fax:  [phone]  Name: Alyssa Vincent MRN: [id number] Date of Birth: 03/31/1971

## 2021-04-23 NOTE — Patient Instructions (Signed)

## 2021-04-24 ENCOUNTER — Other Ambulatory Visit: Payer: Self-pay | Admitting: Nurse Practitioner

## 2021-04-24 ENCOUNTER — Ambulatory Visit: Payer: 59

## 2021-04-24 ENCOUNTER — Other Ambulatory Visit (INDEPENDENT_AMBULATORY_CARE_PROVIDER_SITE_OTHER): Payer: Self-pay | Admitting: Nurse Practitioner

## 2021-04-24 DIAGNOSIS — M6281 Muscle weakness (generalized): Secondary | ICD-10-CM | POA: Diagnosis not present

## 2021-04-24 DIAGNOSIS — M542 Cervicalgia: Secondary | ICD-10-CM | POA: Diagnosis not present

## 2021-04-24 DIAGNOSIS — M436 Torticollis: Secondary | ICD-10-CM | POA: Diagnosis not present

## 2021-04-24 DIAGNOSIS — M25512 Pain in left shoulder: Secondary | ICD-10-CM

## 2021-04-24 MED ORDER — MONTELUKAST SODIUM 10 MG PO TABS: 10.0000 mg | ORAL_TABLET | Freq: Every day | ORAL | 1 refills | Status: DC

## 2021-04-24 NOTE — Patient Instructions (Signed)
  [id number]

## 2021-04-24 NOTE — Therapy (Signed)
[place], [place], [address] Phone: [phone]   Fax:  [phone]  Physical Therapy Treatment  Patient Details  Name: Alyssa Vincent MRN: [id number] Date of Birth: [birth year] Referring Provider (PT): Patricia Nettle, NP   Encounter Date: 04/24/2021   PT End of Session - 04/24/21 1759     Visit Number 4    Number of Visits 17    Date for PT Re-Evaluation 06/05/21    Authorization Type [employer] VL 60    Authorization Time Period FOTO v6, v10    PT Start Time 1745    PT Stop Time 1840   15 minutes moist heat   PT Time Calculation (min) 55 min    Activity Tolerance Patient tolerated treatment well    Behavior During Therapy [facility] for tasks assessed/performed             Past Medical History:  Diagnosis Date   Anxiety    Anxiety attack 11/19/2020   Depression 11/19/2020   Essential hypertension, benign 11/19/2020   GERD (gastroesophageal reflux disease)    Hypertension    Hypothyroidism (acquired) 11/19/2020   Thyroid disease    hypothyroidism    Past Surgical History:  Procedure Laterality Date   BREAST SURGERY Bilateral 2006   Augmentation -saline   HERNIA REPAIR     Umbilical   TONSILLECTOMY      There were no vitals filed for this visit.   Subjective Assessment - 04/24/21 1743     Subjective Pt reports that her neck feels tight and sore today. She states it has improved since this morning, but is still aggravated.    Currently in Pain? Yes    Pain Score 8     Pain Location Neck    Pain Orientation Mid;Lower    Pain Descriptors / Indicators Tightness;Sore                 [facility] Adult PT Treatment/Exercise:   Therapeutic Exercise: - Alternating lower trap lift-off with physioball at wall 3x10 BIL -Seated low rows with 30# cable 3x10 -Seated high rows with 30# cable 3x10 -Seated lat pull-down with 30# cable 3x10 -Upper trap stretch 2x30sec BIL -Levator scap stretch 2x30sec  BIL   Manual Therapy: -STM to BIL cervical paraspinals and upper traps x5 min -Grade IV prone thoracic CPA's throughout upper thoracic spine with cavitations noted throughout -Supine cervical rotation contract/relax MET 3x BIL -Supine cervical manual distraction x66mn   Neuromuscular re-ed: - N/A   Therapeutic Activity: - N/A  -Modalities: -Moist heat to BIL shoulders/ neck x15 minutes   Self-care/Home Management: - See patient education/ instructions          PT Education - 04/24/21 1824     Education Details Updated HEP    Person(s) Educated Patient    Methods Explanation;Handout;Demonstration    Comprehension Verbalized understanding;Returned demonstration              PT Short Term Goals - 04/10/21 1808       PT SHORT TERM GOAL #1   Title Pt will report understanding and adherence to her HEP in order to promote independence in the management of her primary sxs.    Baseline HEP provided at eval.    Time 4    Period Weeks    Status New    Target Date 05/08/21               PT Long Term Goals - 04/10/21 1918  PT LONG TERM GOAL #1   Title Pt will achieve 62% FOTO score in order to demonstrate improved functional ability as it relates to her neck pain.    Baseline 46%    Time 8    Period Weeks    Status New    Target Date 06/05/21      PT LONG TERM GOAL #2   Title Pt will achieve BIL cervical rotation of 65 degrees or greater with 0-4/10 pain in order to turn her head when driving without limitation.    Baseline See flowsheet    Time 8    Period Weeks    Status New    Target Date 06/05/21      PT LONG TERM GOAL #3   Title Pt will achieve WNL BIL shoulder elevation AROM with 0-4/10 pain in order to reach into cabinets without limitation.    Baseline See flowsheet    Time 8    Period Weeks    Status New    Target Date 06/05/21      PT LONG TERM GOAL #4   Title Pt will achieve BIL global parascapular strength of 4+/5 in order to  promote long-term healthy cervical/ scapular positioning.    Baseline 3+/5    Time 8    Period Weeks    Status New    Target Date 06/05/21      PT LONG TERM GOAL #5   Title Pt will achieve cervical flexion and extension AROM of 35 degrees or greater in order to get dressed without limitation.    Baseline 25 degrees each    Time 8    Period Weeks    Status New    Target Date 06/05/21                   Plan - 04/24/21 1825     Clinical Impression Statement Pt arrives to clinic with heightened pain and cervical tightness following her treatment yesterday. Treatment strategies focused primarily on manual therapy and modalities to reduce pain and improve ROM. She responded well to all interventions today, demonstrating good form and no increase in pain with selected exercises. She also demonstrates visually improved cervical rotation AROM following MET. She leaves with improved pain following manual therapy and moist heat, reporting decrease in pain from 8/10 to 6/10 at the end of the session. She will continue to benefit from skilled PT to address her primary impairments and return to her prior level of function with less limitation.    Personal Factors and Comorbidities Comorbidity 3+    Comorbidities hx of anxiety, depression, HTN, hyperlipidemia    Examination-Activity Limitations Bathing;Reach Overhead;Sleep;Lift    Examination-Participation Restrictions Cleaning;Meal Prep;Community Activity;Laundry    Stability/Clinical Decision Making Evolving/Moderate complexity    Clinical Decision Making Moderate    Rehab Potential Good    PT Frequency 2x / week    PT Duration 8 weeks    PT Treatment/Interventions ADLs/Self Care Home Management;Moist Heat;Biofeedback;Cryotherapy;Neuromuscular re-education;Therapeutic exercise;Therapeutic activities;Balance training;Functional mobility training;Stair training;Gait training;Taping;Manual techniques;Dry needling;Passive range of  motion;Patient/family education    PT Next Visit Plan Progress DNF/ scapular strengthening, gentle cervical AROM/ introduction of cervical concentric strengthening    PT Home Exercise Plan [id number]    Consulted and Agree with Plan of Care Patient             Patient will benefit from skilled therapeutic intervention in order to improve the following deficits and impairments:  Decreased range of motion, Impaired UE functional  use, Hypermobility, Pain, Hypomobility, Impaired flexibility, Decreased mobility, Decreased strength  Visit Diagnosis: Cervicalgia  Stiffness of cervical spine  Acute pain of left shoulder  Muscle weakness (generalized)     Problem List Patient Active Problem List   Diagnosis Date Noted   Essential hypertension, benign 11/19/2020   Hypothyroidism (acquired) 11/19/2020   Depression 11/19/2020   Anxiety attack 11/19/2020    Vanessa [place], PT, DPT 04/24/21 6:41 PM   [facility] [address] [place], [place], [address] Phone: [phone]   Fax:  [phone]  Name: Keeya Dyckman MRN: [id number] Date of Birth: 01-31-1971

## 2021-04-26 ENCOUNTER — Other Ambulatory Visit (INDEPENDENT_AMBULATORY_CARE_PROVIDER_SITE_OTHER): Payer: Self-pay | Admitting: Nurse Practitioner

## 2021-04-29 MED ORDER — SERTRALINE HCL 50 MG PO TABS: 50.0000 mg | ORAL_TABLET | Freq: Every day | ORAL | 1 refills | Status: DC

## 2021-04-29 NOTE — Telephone Encounter (Signed)
Please advise as the pt has sent in a refill request for her Sertraline 50mg .  Lov: 03/15/2021

## 2021-04-30 ENCOUNTER — Other Ambulatory Visit: Payer: Self-pay

## 2021-04-30 ENCOUNTER — Ambulatory Visit: Payer: 59 | Attending: Student

## 2021-04-30 DIAGNOSIS — M436 Torticollis: Secondary | ICD-10-CM | POA: Insufficient documentation

## 2021-04-30 DIAGNOSIS — M6281 Muscle weakness (generalized): Secondary | ICD-10-CM | POA: Diagnosis not present

## 2021-04-30 DIAGNOSIS — M542 Cervicalgia: Secondary | ICD-10-CM | POA: Insufficient documentation

## 2021-04-30 DIAGNOSIS — M25512 Pain in left shoulder: Secondary | ICD-10-CM | POA: Insufficient documentation

## 2021-04-30 NOTE — Therapy (Addendum)
[place], [place], [address] Phone: [phone]   Fax:  [phone]  Physical Therapy Treatment  Patient Details  Name: Alyssa Vincent MRN: [id number] Date of Birth: 1971/01/14 Referring Provider (PT): Viona Gilmore D, NP   Encounter Date: 04/30/2021   PT End of Session - 04/30/21 1841     Visit Number 5    Number of Visits 17    Date for PT Re-Evaluation 06/05/21    Authorization Type [employer] VL 60    Authorization Time Period FOTO v6, v10    PT Start Time 1745    PT Stop Time 1837   MHP 10 minutes   PT Time Calculation (min) 52 min    Activity Tolerance Patient tolerated treatment well    Behavior During Therapy [facility] for tasks assessed/performed             Past Medical History:  Diagnosis Date   Anxiety    Anxiety attack 11/19/2020   Depression 11/19/2020   Essential hypertension, benign 11/19/2020   GERD (gastroesophageal reflux disease)    Hypertension    Hypothyroidism (acquired) 11/19/2020   Thyroid disease    hypothyroidism    Past Surgical History:  Procedure Laterality Date   BREAST SURGERY Bilateral 2006   Augmentation -saline   HERNIA REPAIR     Umbilical   TONSILLECTOMY      There were no vitals filed for this visit.   Subjective Assessment - 04/30/21 1747     Subjective Pt reports that her neck feels sore today but felt "good sore" after the previous session. Pt says her Lt knee has been hurting.    Diagnostic tests 03/15/2021: DG Cervical Spine: IMPRESSION:  1. Trace anterolisthesis of C2 on C3, C3 on C4, and C5 on C6 as  above. Flexion and extension radiographs may be obtained to evaluate  for dynamic translation.  2. Otherwise, unremarkable cervical spine radiographs.    Patient Stated Goals Go up stairs with reciprocal pattern, walking    Currently in Pain? Yes    Pain Score 5     Pain Location Neck    Pain Orientation Right    Pain Descriptors / Indicators Sore     Pain Type Chronic pain    Pain Onset More than a month ago    Pain Frequency Constant    Pain Relieving Factors heat, stretches, exercises    Multiple Pain Sites No                [facility] PT Assessment - 04/30/21 0001       AROM   Overall AROM Comments BIL shoulder flexion and abduction AROM WFL, minimal pain in thoracic spine    Cervical Flexion 25% limited, painful    Cervical Extension WFL, min pain    Cervical - Right Side Bend WFL, painful    Cervical - Left Side Bend 25% limited compared to L, painful on R      Palpation   Spinal mobility TTP along SP in C2-C7, hypombility and TTP on spinous process C7-T6    Palpation comment TTP BIL cervical paraspinals/ suboccipital muscles      Special Tests   Other special tests DNF endurance testing: chin tuck lost and aberrant movement at 12 secs.           [place] Adult PT Treatment/Exercise:   Therapeutic Exercise: - DNF 5s holds x5, 10s holds x10; heavy cues for proper activation   *not performed today:  -  Alternating lower trap lift-off with physioball at wall 3x10 BIL -Seated low rows with 30# cable 3x10 -Seated high rows with 30# cable 3x10 -Seated lat pull-down with 30# cable 3x10 -Upper trap stretch 2x30sec BIL -Levator scap stretch 2x30sec BIL   Manual Therapy: - PROM and passive accessory ROM of cervical spine (flexion, extension, SB, rot, down glide, AO/AA rotation, side glide)  -STM to BIL cervical paraspinals and upper traps -Grade III prone thoracic CPA's throughout upper thoracic spine  -Supine cervical rotation contract/relax MET 3x R SB only -Supine cervical manual distraction    Neuromuscular re-ed: - N/A   Therapeutic Activity: - N/A   -Modalities: -Moist heat to BIL shoulders/ neck x10 minutes and upper back.    Self-care/Home Management: - N/A                           PT Short Term Goals - 04/10/21 1808       PT SHORT TERM GOAL #1   Title Pt will report  understanding and adherence to her HEP in order to promote independence in the management of her primary sxs.    Baseline HEP provided at eval.    Time 4    Period Weeks    Status New    Target Date 05/08/21               PT Long Term Goals - 04/10/21 1918       PT LONG TERM GOAL #1   Title Pt will achieve 62% FOTO score in order to demonstrate improved functional ability as it relates to her neck pain.    Baseline 46%    Time 8    Period Weeks    Status New    Target Date 06/05/21      PT LONG TERM GOAL #2   Title Pt will achieve BIL cervical rotation of 65 degrees or greater with 0-4/10 pain in order to turn her head when driving without limitation.    Baseline See flowsheet    Time 8    Period Weeks    Status New    Target Date 06/05/21      PT LONG TERM GOAL #3   Title Pt will achieve WNL BIL shoulder elevation AROM with 0-4/10 pain in order to reach into cabinets without limitation.    Baseline See flowsheet    Time 8    Period Weeks    Status New    Target Date 06/05/21      PT LONG TERM GOAL #4   Title Pt will achieve BIL global parascapular strength of 4+/5 in order to promote long-term healthy cervical/ scapular positioning.    Baseline 3+/5    Time 8    Period Weeks    Status New    Target Date 06/05/21      PT LONG TERM GOAL #5   Title Pt will achieve cervical flexion and extension AROM of 35 degrees or greater in order to get dressed without limitation.    Baseline 25 degrees each    Time 8    Period Weeks    Status New    Target Date 06/05/21                   Plan - 04/30/21 1843     Clinical Impression Statement Pt presents with palpable tightness in cervical paraspinals, occipitals, and upper trapezius muscle with reported tenderness in these areas. Decreased palpable  tightness following soft tissue massage, PROM, and stretching with reports of decreased pain as well. Pt demonstrates improved cervical spine ROM following MET for Rt  SB. Pt has improved PROM and decreased muscle guarding with PROM following cervical distraction. Pt displayed increase in control and engagement of deep neck flexors after multiple sets of supine chin tuck lifts and reports the "muscles feel worked" at the end of the exercise.    Personal Factors and Comorbidities Comorbidity 3+    Comorbidities hx of anxiety, depression, HTN, hyperlipidemia    Examination-Activity Limitations Bathing;Reach Overhead;Sleep;Lift    Examination-Participation Restrictions Cleaning;Meal Prep;Community Activity;Laundry    Stability/Clinical Decision Making Evolving/Moderate complexity    Rehab Potential Good    PT Frequency 2x / week    PT Duration 8 weeks    PT Treatment/Interventions ADLs/Self Care Home Management;Moist Heat;Biofeedback;Cryotherapy;Neuromuscular re-education;Therapeutic exercise;Therapeutic activities;Balance training;Functional mobility training;Stair training;Gait training;Taping;Manual techniques;Dry needling;Passive range of motion;Patient/family education    PT Next Visit Plan Progress DNF/ scapular strengthening, gentle cervical AROM/ introduction of cervical concentric strengthening    PT Home Exercise Plan [id number]    Consulted and Agree with Plan of Care Patient             Patient will benefit from skilled therapeutic intervention in order to improve the following deficits and impairments:  Decreased range of motion, Impaired UE functional use, Hypermobility, Pain, Hypomobility, Impaired flexibility, Decreased mobility, Decreased strength  Visit Diagnosis: No diagnosis found.     Problem List Patient Active Problem List   Diagnosis Date Noted   Essential hypertension, benign 11/19/2020   Hypothyroidism (acquired) 11/19/2020   Depression 11/19/2020   Anxiety attack 11/19/2020   Glade Lloyd, SPT 04/30/21 7:08 PM   [place] [facility] [address] [place], [place],  [address] Phone: [phone]   Fax:  [phone]  Name: Daralyn Bert MRN: [id number] Date of Birth: 1970/11/22

## 2021-05-01 ENCOUNTER — Ambulatory Visit: Payer: 59

## 2021-05-01 DIAGNOSIS — M542 Cervicalgia: Secondary | ICD-10-CM | POA: Diagnosis not present

## 2021-05-01 DIAGNOSIS — M436 Torticollis: Secondary | ICD-10-CM | POA: Diagnosis not present

## 2021-05-01 DIAGNOSIS — M25512 Pain in left shoulder: Secondary | ICD-10-CM | POA: Diagnosis not present

## 2021-05-01 DIAGNOSIS — M6281 Muscle weakness (generalized): Secondary | ICD-10-CM

## 2021-05-01 NOTE — Therapy (Addendum)
[place], [place], [address] Phone: [phone]   Fax:  [phone]  Physical Therapy Treatment  Patient Details  Name: Alyssa Vincent MRN: [id number] Date of Birth: [birth year] Referring Provider (PT): Viona Gilmore D, NP   Encounter Date: 05/01/2021   PT End of Session - 05/01/21 1832     Visit Number 6    Number of Visits 17    Date for PT Re-Evaluation 06/05/21    Authorization Type A[employer] VL [id number]    Authorization Time Period FOTO v6, v10    PT Start Time 4403    PT Stop Time 4742   MHP for 10 minutes at end of session, 2 minutes for dry needling   PT Time Calculation (min) 50 min    Activity Tolerance Patient tolerated treatment well    Behavior During Therapy [facility] for tasks assessed/performed             Past Medical History:  Diagnosis Date   Anxiety    Anxiety attack 11/19/2020   Depression 11/19/2020   Essential hypertension, benign 11/19/2020   GERD (gastroesophageal reflux disease)    Hypertension    Hypothyroidism (acquired) 11/19/2020   Thyroid disease    hypothyroidism    Past Surgical History:  Procedure Laterality Date   BREAST SURGERY Bilateral 2006   Augmentation -saline   HERNIA REPAIR     Umbilical   TONSILLECTOMY      There were no vitals filed for this visit.   Subjective Assessment - 05/01/21 1748     Subjective Pt reports that her neck feels sore today, but that her shoulder is bothering her the most today. She says that when taking blood at her job she will get a "twinge" in the back of her shoulder by the bottom of her scapula.    Diagnostic tests 03/15/2021: DG Cervical Spine: IMPRESSION:  1. Trace anterolisthesis of C2 on C3, C3 on C4, and C5 on C6 as  above. Flexion and extension radiographs may be obtained to evaluate  for dynamic translation.  2. Otherwise, unremarkable cervical spine radiographs.    Patient Stated Goals Go up stairs with reciprocal pattern,  walking    Currently in Pain? Yes    Pain Score 5     Pain Location Shoulder    Pain Orientation Right    Pain Descriptors / Indicators Sore    Pain Type Chronic pain    Pain Onset More than a month ago               [facility] Adult PT Treatment/Exercise:   Therapeutic Exercise: - Arm erg, forward 4mins, backward 34mins  - DNF 10s holds x10; heavy cues for proper activation - thoracic extension, seated with bolster. 1x10 w/o bolster, 1x15 with bolster  - thoracic extension leaning over a chair with upper extremities above head, 3x30sec - Shoulder IR/ER bilat, 2x15 each, red thereband    *not performed today:  - Alternating lower trap lift-off with physioball at wall 3x10 BIL -Seated low rows with 30# cable 3x10 -Seated high rows with 30# cable 3x10 -Seated lat pull-down with 30# cable 3x10 -Upper trap stretch 2x30sec BIL -Levator scap stretch 2x30sec BIL    Manual Therapy: - N/A   Neuromuscular re-ed: - N/A   Therapeutic Activity: - N/A   -Modalities: -Moist heat to BIL shoulders/ neck x10 minutes, seated   Self-care/Home Management: - N/A  Trigger Point Dry Needling - 05/01/21 0001     Consent Given? Yes    Education Handout Provided Previously provided    Muscles Treated Head and Neck Upper trapezius    Upper Trapezius Response Twitch reponse elicited;Palpable increased muscle length                     PT Short Term Goals - 04/10/21 1808       PT SHORT TERM GOAL #1   Title Pt will report understanding and adherence to her HEP in order to promote independence in the management of her primary sxs.    Baseline HEP provided at eval.    Time 4    Period Weeks    Status New    Target Date 05/08/21               PT Long Term Goals - 04/10/21 1918       PT LONG TERM GOAL #1   Title Pt will achieve 62% FOTO score in order to demonstrate improved functional ability as it relates to her neck pain.    Baseline  46%    Time 8    Period Weeks    Status New    Target Date 06/05/21      PT LONG TERM GOAL #2   Title Pt will achieve BIL cervical rotation of 65 degrees or greater with 0-4/10 pain in order to turn her head when driving without limitation.    Baseline See flowsheet    Time 8    Period Weeks    Status New    Target Date 06/05/21      PT LONG TERM GOAL #3   Title Pt will achieve WNL BIL shoulder elevation AROM with 0-4/10 pain in order to reach into cabinets without limitation.    Baseline See flowsheet    Time 8    Period Weeks    Status New    Target Date 06/05/21      PT LONG TERM GOAL #4   Title Pt will achieve BIL global parascapular strength of 4+/5 in order to promote long-term healthy cervical/ scapular positioning.    Baseline 3+/5    Time 8    Period Weeks    Status New    Target Date 06/05/21      PT LONG TERM GOAL #5   Title Pt will achieve cervical flexion and extension AROM of 35 degrees or greater in order to get dressed without limitation.    Baseline 25 degrees each    Time 8    Period Weeks    Status New    Target Date 06/05/21                   Plan - 05/01/21 1834     Clinical Impression Statement Pt presents with palpable tightness in cervical paraspinals, occipitals, and upper trapezius muscle with reported tenderness in these areas. Decreased palpable tightness noted following dry needling of the upper trapezius muscle with multiple muscle twitches noted. Pt require several verbal cues and tactile cues for proper form and muscle activation during shoulder external rotation and internal rotations with moderate muscle fatigue reported by the pt. Pt able to perform deep neck flexor exercise with improve muscle activation and form with moderate fatigue noted following the exercise. Able to add thoracic extension exercises with no reports of pain, improve ROM, and reports of her "back feeling better" after the exercise. Pt skin within normal  limits  following moist hot pack at the end of the session.    Personal Factors and Comorbidities Comorbidity 3+    Comorbidities hx of anxiety, depression, HTN, hyperlipidemia    Examination-Activity Limitations Bathing;Reach Overhead;Sleep;Lift    Examination-Participation Restrictions Cleaning;Meal Prep;Community Activity;Laundry    Stability/Clinical Decision Making Evolving/Moderate complexity    Rehab Potential Good    PT Frequency 2x / week    PT Duration 8 weeks    PT Treatment/Interventions ADLs/Self Care Home Management;Moist Heat;Biofeedback;Cryotherapy;Neuromuscular re-education;Therapeutic exercise;Therapeutic activities;Balance training;Functional mobility training;Stair training;Gait training;Taping;Manual techniques;Dry needling;Passive range of motion;Patient/family education    PT Next Visit Plan Progress DNF/ scapular strengthening, gentle cervical AROM/ introduction of cervical concentric strengthening    PT Home Exercise Plan [id number]    Consulted and Agree with Plan of Care Patient             Patient will benefit from skilled therapeutic intervention in order to improve the following deficits and impairments:  Decreased range of motion, Impaired UE functional use, Hypermobility, Pain, Hypomobility, Impaired flexibility, Decreased mobility, Decreased strength  Visit Diagnosis: Cervicalgia  Stiffness of cervical spine  Acute pain of left shoulder  Muscle weakness (generalized)     Problem List Patient Active Problem List   Diagnosis Date Noted   Essential hypertension, benign 11/19/2020   Hypothyroidism (acquired) 11/19/2020   Depression 11/19/2020   Anxiety attack 11/19/2020    Glade Lloyd, SPT 05/01/21 6:59 PM   [place], [place], [address] Phone: [phone]   Fax:  [phone]  Name: Alyssa Vincent MRN: [id number] Date of Birth: 03/02/1971
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
## 2021-05-02 ENCOUNTER — Encounter: Payer: Self-pay | Admitting: Family Medicine

## 2021-05-02 ENCOUNTER — Other Ambulatory Visit: Payer: Self-pay

## 2021-05-02 ENCOUNTER — Ambulatory Visit (INDEPENDENT_AMBULATORY_CARE_PROVIDER_SITE_OTHER): Payer: 59 | Admitting: Family Medicine

## 2021-05-02 VITALS — BP 120/86 | HR 80 | Ht 62.0 in | Wt 200.2 lb

## 2021-05-02 DIAGNOSIS — M222X2 Patellofemoral disorders, left knee: Secondary | ICD-10-CM

## 2021-05-02 DIAGNOSIS — M2242 Chondromalacia patellae, left knee: Secondary | ICD-10-CM | POA: Diagnosis not present

## 2021-05-02 DIAGNOSIS — M5412 Radiculopathy, cervical region: Secondary | ICD-10-CM | POA: Diagnosis not present

## 2021-05-02 NOTE — Patient Instructions (Signed)
Good to see you today.  I've sent in a new referral for PT to the [facility] location.  Please let me know if there are any issues.  Follow-up: either in person in one month or contact me prior for a knee MRI if not better.

## 2021-05-06 ENCOUNTER — Encounter: Payer: Self-pay | Admitting: Family Medicine

## 2021-05-08 ENCOUNTER — Ambulatory Visit: Payer: 59

## 2021-05-13 ENCOUNTER — Other Ambulatory Visit: Payer: Self-pay | Admitting: Student

## 2021-05-13 DIAGNOSIS — M5412 Radiculopathy, cervical region: Secondary | ICD-10-CM

## 2021-05-17 ENCOUNTER — Other Ambulatory Visit: Payer: Self-pay | Admitting: Nurse Practitioner

## 2021-05-17 MED ORDER — CYCLOBENZAPRINE HCL 10 MG PO TABS: 10.0000 mg | ORAL_TABLET | ORAL | 2 refills | Status: AC | PRN

## 2021-05-17 MED ORDER — ALPRAZOLAM 0.25 MG PO TABS: 0.2500 mg | ORAL_TABLET | Freq: Every day | ORAL | 0 refills | Status: DC | PRN

## 2021-06-04 ENCOUNTER — Other Ambulatory Visit: Payer: Self-pay

## 2021-06-04 ENCOUNTER — Ambulatory Visit: Payer: 59 | Attending: Family Medicine

## 2021-06-04 DIAGNOSIS — M542 Cervicalgia: Secondary | ICD-10-CM | POA: Insufficient documentation

## 2021-06-04 DIAGNOSIS — M6281 Muscle weakness (generalized): Secondary | ICD-10-CM | POA: Insufficient documentation

## 2021-06-04 DIAGNOSIS — M436 Torticollis: Secondary | ICD-10-CM | POA: Insufficient documentation

## 2021-06-04 DIAGNOSIS — M222X2 Patellofemoral disorders, left knee: Secondary | ICD-10-CM | POA: Diagnosis not present

## 2021-06-04 DIAGNOSIS — G8929 Other chronic pain: Secondary | ICD-10-CM | POA: Diagnosis not present

## 2021-06-04 DIAGNOSIS — M25512 Pain in left shoulder: Secondary | ICD-10-CM | POA: Diagnosis not present

## 2021-06-04 DIAGNOSIS — M25562 Pain in left knee: Secondary | ICD-10-CM | POA: Diagnosis not present
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
## 2021-06-08 ENCOUNTER — Ambulatory Visit
Admission: RE | Admit: 2021-06-08 | Discharge: 2021-06-08 | Disposition: A | Discharge status: Home / Self Care | Payer: Self-pay | Source: Ambulatory Visit | Attending: Student | Admitting: Student

## 2021-06-08 ENCOUNTER — Other Ambulatory Visit: Payer: Self-pay

## 2021-06-08 DIAGNOSIS — M5412 Radiculopathy, cervical region: Secondary | ICD-10-CM

## 2021-06-08 DIAGNOSIS — M2578 Osteophyte, vertebrae: Secondary | ICD-10-CM | POA: Diagnosis not present

## 2021-06-08 DIAGNOSIS — M4802 Spinal stenosis, cervical region: Secondary | ICD-10-CM | POA: Diagnosis not present

## 2021-06-11 ENCOUNTER — Encounter: Payer: Self-pay | Admitting: Physical Therapy

## 2021-06-11 ENCOUNTER — Other Ambulatory Visit: Payer: Self-pay

## 2021-06-11 ENCOUNTER — Ambulatory Visit: Payer: 59 | Admitting: Physical Therapy

## 2021-06-11 DIAGNOSIS — M436 Torticollis: Secondary | ICD-10-CM

## 2021-06-11 DIAGNOSIS — M542 Cervicalgia: Secondary | ICD-10-CM

## 2021-06-11 DIAGNOSIS — G8929 Other chronic pain: Secondary | ICD-10-CM | POA: Diagnosis not present

## 2021-06-11 DIAGNOSIS — M6281 Muscle weakness (generalized): Secondary | ICD-10-CM | POA: Diagnosis not present

## 2021-06-11 DIAGNOSIS — M25512 Pain in left shoulder: Secondary | ICD-10-CM | POA: Diagnosis not present

## 2021-06-11 DIAGNOSIS — M25562 Pain in left knee: Secondary | ICD-10-CM | POA: Diagnosis not present

## 2021-06-11 DIAGNOSIS — M222X2 Patellofemoral disorders, left knee: Secondary | ICD-10-CM | POA: Diagnosis not present

## 2021-06-11 NOTE — Therapy (Signed)
[place], [place], [address] Phone: [phone]   Fax:  [phone]  Physical Therapy Treatment  Patient Details  Name: Alyssa Vincent MRN: [id number] Date of Birth: 08/17/1970 Referring Provider (PT): Gregor Hams, MD for knee; Patricia Nettle, NP for neck   Encounter Date: 06/11/2021   PT End of Session - 06/11/21 1746     Visit Number 8    Number of Visits 19    Date for PT Re-Evaluation 07/20/21    Authorization Type [employer] VL [id number]    Authorization Time Period FOTO v6, v10    PT Start Time 1745    PT Stop Time 3382    PT Time Calculation (min) 42 min    Activity Tolerance Patient tolerated treatment well    Behavior During Therapy [facility] for tasks assessed/performed             Past Medical History:  Diagnosis Date   Anxiety    Anxiety attack 11/19/2020   Depression 11/19/2020   Essential hypertension, benign 11/19/2020   GERD (gastroesophageal reflux disease)    Hypertension    Hypothyroidism (acquired) 11/19/2020   Thyroid disease    hypothyroidism    Past Surgical History:  Procedure Laterality Date   BREAST SURGERY Bilateral 2006   Augmentation -saline   HERNIA REPAIR     Umbilical   TONSILLECTOMY      There were no vitals filed for this visit.   Subjective Assessment - 06/11/21 1750     Subjective Pt reports that she is doing fair today.  She has pain in a ram's horn pattern at times.  She has no radiation into the UEs today.  8/10 pain today    Diagnostic tests 03/15/2021: DG Cervical Spine: IMPRESSION:  1. Trace anterolisthesis of C2 on C3, C3 on C4, and C5 on C6 as  above. Flexion and extension radiographs may be obtained to evaluate  for dynamic translation.  2. Otherwise, unremarkable cervical spine radiographs.    Patient Stated Goals Go up stairs with reciprocal pattern, walking    Pain Onset More than a month ago             [facility] Adult PT Treatment/Exercise:    Therapeutic Exercise: - Arm erg, forward 39mins, backward 69mins  - Scapular retraction - 20x - DNF 20'' x4 heavy cues for proper activation - Bil shoulder ER with RTB - 3x10 - Global ext on wall with pball wall 2x10 BIL    Manual Therapy: - PROM and passive accessory ROM of cervical spine (flexion, extension, SB, rot, down glide, AO/AA rotation, side glide)  -STM to BIL upper traps -Grade III supine thoracic PA upper tx spine -Supine cervical manual distraction with SO release      PT Short Term Goals - 06/05/21 0858       PT SHORT TERM GOAL #1   Title Pt will report understanding and adherence to her HEP in order to promote independence in the management of her sxs.    Baseline HEP provided at eval.    Time 4    Period Weeks    Status Achieved    Target Date 05/08/21      PT SHORT TERM GOAL #2   Title Pt will be independant with initial HEP for knee to improve pain management.    Baseline issued at eval    Status New    Target Date 06/26/21      PT SHORT  TERM GOAL #3   Title Pt will be able to demonstrate recipricol gait pattern on stairs without knee pain to improve function with occupational tasks.    Baseline unable    Status New    Target Date 06/26/21               PT Long Term Goals - 06/05/21 0905       PT LONG TERM GOAL #1   Title Pt will achieve 62% FOTO score in order to demonstrate improved functional ability as it relates to her neck pain.    Baseline 46%    Time 8    Period Weeks    Status On-going    Target Date 06/05/21      PT LONG TERM GOAL #2   Title Pt will achieve BIL cervical rotation of 65 degrees or greater with 0-4/10 pain in order to turn her head when driving without limitation.    Baseline See flowsheet    Time 8    Period Weeks    Status Achieved    Target Date 06/05/21      PT LONG TERM GOAL #3   Title Pt will achieve WNL BIL shoulder elevation AROM with 0-4/10 pain in order to reach into cabinets without limitation.     Baseline See flowsheet    Time 8    Period Weeks    Status Deferred    Target Date 06/05/21      PT LONG TERM GOAL #4   Title Pt will achieve BIL global parascapular strength of 4+/5 in order to promote long-term healthy cervical/ scapular positioning.    Baseline 3+/5    Time 8    Period Weeks    Status On-going    Target Date 06/05/21      PT LONG TERM GOAL #5   Title Pt will achieve cervical flexion and extension AROM of 35 degrees or greater in order to get dressed without limitation.    Baseline 25 degrees each    Time 8    Period Weeks    Status On-going    Target Date 06/05/21      Additional Long Term Goals   Additional Long Term Goals Yes      PT LONG TERM GOAL #6   Title Pt will be able to achieve 61% predicted function on FOTO for knee to indicate clinically meaningful improvement of function.    Baseline see flowsheet    Status New    Target Date 07/17/21      PT LONG TERM GOAL #7   Title Pt will be able to achieve 4+/5 strength on all Lt hip and knee MMTs to improve knee stability during standing and walking.    Baseline see flowsheet    Status New    Target Date 07/17/21      PT LONG TERM GOAL #8   Title Pt will be able to demonstrate proper squat mechanics without pain to improve transfers and occupational tasks.    Baseline unable    Status New    Target Date 07/17/21                   Plan - 06/11/21 1828     Clinical Impression Statement Overall, Alyssa Vincent is progressing well with therapy.  Pt reports significant pain reduction following therapy (>/= 3 pts NPRS).  Today we concentrated on cervical mobility  and pain reduction.  Pt reports minimal pain following MT, "just pressure".  Sub  occipital release is particularly effective.  Tennis ball "peanut" issued for use at home for Sub occipital release.  Pt will continue to benefit from skilled physical therapy to address remaining deficits and achieve listed goals.  Continue per POC.    Personal  Factors and Comorbidities Comorbidity 3+    Comorbidities hx of anxiety, depression, HTN, hyperlipidemia    Examination-Activity Limitations Bathing;Reach Overhead;Sleep;Lift;Stairs;Squat    Examination-Participation Restrictions Cleaning;Meal Prep;Community Activity;Laundry;Occupation    Stability/Clinical Decision Making Stable/Uncomplicated    Rehab Potential Good    PT Frequency Other (comment)   1-2 a week   PT Duration 6 weeks    PT Treatment/Interventions ADLs/Self Care Home Management;Moist Heat;Biofeedback;Cryotherapy;Neuromuscular re-education;Therapeutic exercise;Therapeutic activities;Balance training;Functional mobility training;Stair training;Gait training;Taping;Manual techniques;Dry needling;Passive range of motion;Patient/family education;Electrical Stimulation;Iontophoresis 4mg /ml Dexamethasone;Vasopneumatic Device    PT Next Visit Plan Progress HEP; Progress DNF/ scapular strengthening, cervical AROM/ introduction of cervical concentric strengthening; Assess squat mechanics and stairs with patellar glide; possible DN for cervical spine, taping for patellar maltracking, and manual for pain management. Assess gastroc strength and DF AROM.    PT Home Exercise Plan [id number]    Consulted and Agree with Plan of Care Patient             Patient will benefit from skilled therapeutic intervention in order to improve the following deficits and impairments:  Decreased range of motion, Impaired UE functional use, Hypermobility, Pain, Hypomobility, Impaired flexibility, Decreased mobility, Decreased strength, Postural dysfunction  Visit Diagnosis: Cervicalgia  Stiffness of cervical spine  Acute pain of left shoulder  Muscle weakness (generalized)  Chronic pain of left knee     Problem List Patient Active Problem List   Diagnosis Date Noted   Essential hypertension, benign 11/19/2020   Hypothyroidism (acquired) 11/19/2020   Depression 11/19/2020   Anxiety attack  11/19/2020    Mathis Dad, PT 06/11/2021, 6:30 PM  [place] [facility] [address] [place], [place], [address] Phone: [phone]   Fax:  [phone]  Name: Alyssa Vincent MRN: [id number] Date of Birth: Aug 26, 1970

## 2021-06-13 ENCOUNTER — Ambulatory Visit: Payer: 59

## 2021-06-18 ENCOUNTER — Ambulatory Visit: Payer: 59

## 2021-06-18 ENCOUNTER — Other Ambulatory Visit: Payer: Self-pay

## 2021-06-18 DIAGNOSIS — G8929 Other chronic pain: Secondary | ICD-10-CM | POA: Diagnosis not present

## 2021-06-18 DIAGNOSIS — M542 Cervicalgia: Secondary | ICD-10-CM | POA: Diagnosis not present

## 2021-06-18 DIAGNOSIS — M436 Torticollis: Secondary | ICD-10-CM | POA: Diagnosis not present

## 2021-06-18 DIAGNOSIS — M25512 Pain in left shoulder: Secondary | ICD-10-CM | POA: Diagnosis not present

## 2021-06-18 DIAGNOSIS — M25562 Pain in left knee: Secondary | ICD-10-CM | POA: Diagnosis not present

## 2021-06-18 DIAGNOSIS — M222X2 Patellofemoral disorders, left knee: Secondary | ICD-10-CM | POA: Diagnosis not present

## 2021-06-18 DIAGNOSIS — M6281 Muscle weakness (generalized): Secondary | ICD-10-CM

## 2021-06-18 NOTE — Therapy (Signed)
[place], [place], [address] Phone: [phone]   Fax:  [phone]  Physical Therapy Treatment  Patient Details  Name: Alyssa Vincent MRN: [id number] Date of Birth: 16-Apr-1971 Referring Provider (PT): Gregor Hams, MD for knee; Patricia Nettle, NP for neck   Encounter Date: 06/18/2021   PT End of Session - 06/18/21 1737     Visit Number 9    Number of Visits 19    Date for PT Re-Evaluation 07/20/21    Authorization Type [employer] VL 60    Authorization Time Period FOTO v6, v10    PT Start Time 5361    PT Stop Time 4431   TPDN 5 minutes   PT Time Calculation (min) 46 min    Activity Tolerance Patient tolerated treatment well    Behavior During Therapy [facility] for tasks assessed/performed             Past Medical History:  Diagnosis Date   Anxiety    Anxiety attack 11/19/2020   Depression 11/19/2020   Essential hypertension, benign 11/19/2020   GERD (gastroesophageal reflux disease)    Hypertension    Hypothyroidism (acquired) 11/19/2020   Thyroid disease    hypothyroidism    Past Surgical History:  Procedure Laterality Date   BREAST SURGERY Bilateral 2006   Augmentation -saline   HERNIA REPAIR     Umbilical   TONSILLECTOMY      There were no vitals filed for this visit.   Subjective Assessment - 06/18/21 1747     Subjective Patient reports the knee is bothering her the most right now, but she also slept funny and the neck is still bothering her too.    Currently in Pain? Yes    Pain Score 7     Pain Location Knee    Pain Orientation Left;Anterior;Medial    Pain Descriptors / Indicators Sharp    Pain Type Chronic pain    Pain Onset More than a month ago    Pain Frequency Constant    Aggravating Factors  unknown    Pain Relieving Factors stretching    Multiple Pain Sites Yes    Pain Score 8    Pain Location Neck    Pain Orientation Posterior    Pain Descriptors / Indicators  Tightness    Pain Type Chronic pain    Pain Onset More than a month ago    Pain Frequency Constant    Aggravating Factors  sleeping wrong    Pain Relieving Factors exercises             [facility] Adult PT Treatment/Exercise:   Therapeutic Exercise: - Nustep level 5 x 5 minutes   - SLR 2 x 10 LLE - body weight squats 2 x 3  - sidelying hip abduction 2 x 10 LLE     Manual Therapy: - STM/DTM bilateral upper traps, levator scap - suboccipital release - K-tape to Lt knee Y strip from distal quad to patellar tendon and I strip along patellar tendon                               PT Short Term Goals - 06/05/21 0858       PT SHORT TERM GOAL #1   Title Pt will report understanding and adherence to her HEP in order to promote independence in the management of her sxs.    Baseline HEP provided at  eval.    Time 4    Period Weeks    Status Achieved    Target Date 05/08/21      PT SHORT TERM GOAL #2   Title Pt will be independant with initial HEP for knee to improve pain management.    Baseline issued at eval    Status New    Target Date 06/26/21      PT SHORT TERM GOAL #3   Title Pt will be able to demonstrate recipricol gait pattern on stairs without knee pain to improve function with occupational tasks.    Baseline unable    Status New    Target Date 06/26/21               PT Long Term Goals - 06/05/21 0905       PT LONG TERM GOAL #1   Title Pt will achieve 62% FOTO score in order to demonstrate improved functional ability as it relates to her neck pain.    Baseline 46%    Time 8    Period Weeks    Status On-going    Target Date 06/05/21      PT LONG TERM GOAL #2   Title Pt will achieve BIL cervical rotation of 65 degrees or greater with 0-4/10 pain in order to turn her head when driving without limitation.    Baseline See flowsheet    Time 8    Period Weeks    Status Achieved    Target Date 06/05/21      PT LONG TERM GOAL #3    Title Pt will achieve WNL BIL shoulder elevation AROM with 0-4/10 pain in order to reach into cabinets without limitation.    Baseline See flowsheet    Time 8    Period Weeks    Status Deferred    Target Date 06/05/21      PT LONG TERM GOAL #4   Title Pt will achieve BIL global parascapular strength of 4+/5 in order to promote long-term healthy cervical/ scapular positioning.    Baseline 3+/5    Time 8    Period Weeks    Status On-going    Target Date 06/05/21      PT LONG TERM GOAL #5   Title Pt will achieve cervical flexion and extension AROM of 35 degrees or greater in order to get dressed without limitation.    Baseline 25 degrees each    Time 8    Period Weeks    Status On-going    Target Date 06/05/21      Additional Long Term Goals   Additional Long Term Goals Yes      PT LONG TERM GOAL #6   Title Pt will be able to achieve 61% predicted function on FOTO for knee to indicate clinically meaningful improvement of function.    Baseline see flowsheet    Status New    Target Date 07/17/21      PT LONG TERM GOAL #7   Title Pt will be able to achieve 4+/5 strength on all Lt hip and knee MMTs to improve knee stability during standing and walking.    Baseline see flowsheet    Status New    Target Date 07/17/21      PT LONG TERM GOAL #8   Title Pt will be able to demonstrate proper squat mechanics without pain to improve transfers and occupational tasks.    Baseline unable    Status New  Target Date 07/17/21                   Plan - 06/18/21 1920     Clinical Impression Statement Patient tolerated session well today with manual therapy focused on addressing musculature restriction about the cervical spine and taping to assist in patella alignment. Notable decrease in tautness and tenderness about bilateral upper traps and suboccipitals following TPDN and manual therapy techniques. Following K-tape she reported that the knee felt more supportive with squatting,  though still reports pain with this closed chain activity. Good tolerance to hip strengthening, though she quickly fatigues with targeted abductor strengthening.    PT Treatment/Interventions ADLs/Self Care Home Management;Moist Heat;Biofeedback;Cryotherapy;Neuromuscular re-education;Therapeutic exercise;Therapeutic activities;Balance training;Functional mobility training;Stair training;Gait training;Taping;Manual techniques;Dry needling;Passive range of motion;Patient/family education;Electrical Stimulation;Iontophoresis 4mg /ml Dexamethasone;Vasopneumatic Device    PT Next Visit Plan Progress HEP; Progress DNF/ scapular strengthening, cervical AROM/ introduction of cervical concentric strengthening; Assess squat mechanics and stairs with patellar glide; possible DN for cervical spine, taping for patellar maltracking, and manual for pain management. Assess gastroc strength and DF AROM.    PT Home Exercise Plan [id number]    Consulted and Agree with Plan of Care Patient             Patient will benefit from skilled therapeutic intervention in order to improve the following deficits and impairments:  Decreased range of motion, Impaired UE functional use, Hypermobility, Pain, Hypomobility, Impaired flexibility, Decreased mobility, Decreased strength, Postural dysfunction  Visit Diagnosis: Cervicalgia  Stiffness of cervical spine  Acute pain of left shoulder  Muscle weakness (generalized)  Chronic pain of left knee     Problem List Patient Active Problem List   Diagnosis Date Noted   Essential hypertension, benign 11/19/2020   Hypothyroidism (acquired) 11/19/2020   Depression 11/19/2020   Anxiety attack 11/19/2020   Gwendolyn Grant, PT, DPT, ATC 06/19/21 9:08 AM  [facility] [address] [place], [place], [address] Phone: [phone]   Fax:  [phone]  Name: Alyssa Vincent MRN: [id number] Date of Birth: 1971/06/20

## 2021-06-20 ENCOUNTER — Ambulatory Visit: Payer: 59

## 2021-06-20 ENCOUNTER — Other Ambulatory Visit: Payer: Self-pay

## 2021-06-20 DIAGNOSIS — M6281 Muscle weakness (generalized): Secondary | ICD-10-CM | POA: Diagnosis not present

## 2021-06-20 DIAGNOSIS — M25512 Pain in left shoulder: Secondary | ICD-10-CM | POA: Diagnosis not present

## 2021-06-20 DIAGNOSIS — M542 Cervicalgia: Secondary | ICD-10-CM | POA: Diagnosis not present

## 2021-06-20 DIAGNOSIS — M25562 Pain in left knee: Secondary | ICD-10-CM | POA: Diagnosis not present

## 2021-06-20 DIAGNOSIS — M436 Torticollis: Secondary | ICD-10-CM | POA: Diagnosis not present

## 2021-06-20 DIAGNOSIS — M222X2 Patellofemoral disorders, left knee: Secondary | ICD-10-CM | POA: Diagnosis not present

## 2021-06-20 DIAGNOSIS — G8929 Other chronic pain: Secondary | ICD-10-CM | POA: Diagnosis not present

## 2021-06-20 NOTE — Therapy (Signed)
[place], [place], [address] Phone: [phone]   Fax:  [phone]  Physical Therapy Treatment  Patient Details  Name: Alyssa Vincent MRN: [id number] Date of Birth: [birth year] Referring Provider (PT): Gregor Hams, MD for knee; Patricia Nettle, NP for neck   Encounter Date: 06/20/2021   PT End of Session - 06/20/21 1747     Visit Number 10    Number of Visits 19    Date for PT Re-Evaluation 07/20/21    Authorization Type [employer] VL 60    Authorization Time Period FOTO v6, v10    PT Start Time 1747    PT Stop Time 1830   3 min TPDN   PT Time Calculation (min) 43 min    Activity Tolerance Patient tolerated treatment well    Behavior During Therapy [facility] for tasks assessed/performed             Past Medical History:  Diagnosis Date   Anxiety    Anxiety attack 11/19/2020   Depression 11/19/2020   Essential hypertension, benign 11/19/2020   GERD (gastroesophageal reflux disease)    Hypertension    Hypothyroidism (acquired) 11/19/2020   Thyroid disease    hypothyroidism    Past Surgical History:  Procedure Laterality Date   BREAST SURGERY Bilateral 2006   Augmentation -saline   HERNIA REPAIR     Umbilical   TONSILLECTOMY      There were no vitals filed for this visit.   Subjective Assessment - 06/20/21 1748     Subjective Patient reports yesterday she was in a lot of pain in the neck mostly on the Rt side. She reports less tension in the neck following TPDN, but it's still sore. She reports noticing more pain along the medial aspect of the knee with walking today.    Currently in Pain? Yes    Pain Score 6     Pain Location Knee    Pain Orientation Left;Anterior;Medial    Pain Descriptors / Indicators Tender    Pain Type Chronic pain    Pain Onset More than a month ago    Pain Frequency Constant    Aggravating Factors  weightbearing    Pain Relieving Factors rest    Multiple Pain Sites  Yes    Pain Score 6    Pain Location Neck    Pain Orientation Posterior    Pain Descriptors / Indicators Tender    Pain Type Chronic pain    Pain Onset More than a month ago    Pain Frequency Constant    Aggravating Factors  unknown    Pain Relieving Factors heat and tennis ball massage                [facility] PT Assessment - 06/20/21 0001       Observation/Other Assessments   Focus on Therapeutic Outcomes (FOTO)  neck: 46% knee 38%      AROM   Overall AROM Comments Lt knee flexion AROM 84           TREATMENT 06/20/2021:  Therapeutic Exercise: - Repeated cervical retraction seated 1 x 10  - Repeated cervical retraction with extension seated 1 x 10  - repeated cervical retraction supine 1 x10  - repeated cervical retraction with extension supine 1 x 10  - supine hip flexor/quad stretch 60 sec LLE - SLR 1 x 15 LLE Updated HEP  Manual Therapy: - cervical distraction x 1 minute - STM to  quadriceps Lt   Therapeutic Activity: - Education on FOTO score and functional progress as it relates to outcome survey.   Self-care/Home Management: - N/A     Trigger Point Dry Needling Treatment: Pre-treatment instruction: Patient instructed on dry needling rationale, procedures, and possible side effects including pain during treatment (achy,cramping feeling), bruising, drop of blood, lightheadedness, nausea, sweating. Patient Consent Given: Yes Education handout provided: Previously provided Muscles treated: Lt rectus femoris, vastus intermedius, vastus lateralis   Treatment response/outcome: Twitch response elicited and Palpable decrease in muscle tension Post-treatment instructions: Patient instructed to expect possible mild to moderate muscle soreness later today and/or tomorrow. Patient instructed in methods to reduce muscle soreness and to continue prescribed HEP. If patient was dry needled over the lung field, patient was instructed on signs and symptoms of pneumothorax  and, however unlikely, to see immediate medical attention should they occur. Patient was also educated on signs and symptoms of infection and to seek medical attention should they occur. Patient verbalized understanding of these instructions and education.                        PT Short Term Goals - 06/05/21 0858       PT SHORT TERM GOAL #1   Title Pt will report understanding and adherence to her HEP in order to promote independence in the management of her sxs.    Baseline HEP provided at eval.    Time 4    Period Weeks    Status Achieved    Target Date 05/08/21      PT SHORT TERM GOAL #2   Title Pt will be independant with initial HEP for knee to improve pain management.    Baseline issued at eval    Status New    Target Date 06/26/21      PT SHORT TERM GOAL #3   Title Pt will be able to demonstrate recipricol gait pattern on stairs without knee pain to improve function with occupational tasks.    Baseline unable    Status New    Target Date 06/26/21               PT Long Term Goals - 06/05/21 0905       PT LONG TERM GOAL #1   Title Pt will achieve 62% FOTO score in order to demonstrate improved functional ability as it relates to her neck pain.    Baseline 46%    Time 8    Period Weeks    Status On-going    Target Date 06/05/21      PT LONG TERM GOAL #2   Title Pt will achieve BIL cervical rotation of 65 degrees or greater with 0-4/10 pain in order to turn her head when driving without limitation.    Baseline See flowsheet    Time 8    Period Weeks    Status Achieved    Target Date 06/05/21      PT LONG TERM GOAL #3   Title Pt will achieve WNL BIL shoulder elevation AROM with 0-4/10 pain in order to reach into cabinets without limitation.    Baseline See flowsheet    Time 8    Period Weeks    Status Deferred    Target Date 06/05/21      PT LONG TERM GOAL #4   Title Pt will achieve BIL global parascapular strength of 4+/5 in order  to promote long-term healthy cervical/ scapular positioning.  Baseline 3+/5    Time 8    Period Weeks    Status On-going    Target Date 06/05/21      PT LONG TERM GOAL #5   Title Pt will achieve cervical flexion and extension AROM of 35 degrees or greater in order to get dressed without limitation.    Baseline 25 degrees each    Time 8    Period Weeks    Status On-going    Target Date 06/05/21      Additional Long Term Goals   Additional Long Term Goals Yes      PT LONG TERM GOAL #6   Title Pt will be able to achieve 61% predicted function on FOTO for knee to indicate clinically meaningful improvement of function.    Baseline see flowsheet    Status New    Target Date 07/17/21      PT LONG TERM GOAL #7   Title Pt will be able to achieve 4+/5 strength on all Lt hip and knee MMTs to improve knee stability during standing and walking.    Baseline see flowsheet    Status New    Target Date 07/17/21      PT LONG TERM GOAL #8   Title Pt will be able to demonstrate proper squat mechanics without pain to improve transfers and occupational tasks.    Baseline unable    Status New    Target Date 07/17/21                   Plan - 06/20/21 1805     Clinical Impression Statement Trialed repeated cervical extension biased movement in sitting and supine with patient demonstrating positive response to extension biased movement in supine as this reduced her neck pain to 4/10. TPDN was performed to quadriceps musclature with twitch repsonse elicited and patient reporting referral to the patella with TPDN of the vastus lateralis. She reported soreness in the quad post TPDN as expected, otherwise no complaints of increased pain/soreness during session.    PT Treatment/Interventions ADLs/Self Care Home Management;Moist Heat;Biofeedback;Cryotherapy;Neuromuscular re-education;Therapeutic exercise;Therapeutic activities;Balance training;Functional mobility training;Stair training;Gait  training;Taping;Manual techniques;Dry needling;Passive range of motion;Patient/family education;Electrical Stimulation;Iontophoresis 4mg /ml Dexamethasone;Vasopneumatic Device    PT Next Visit Plan , taping for patellar maltracking, consider ionto and manual for pain management. quad strengthening, periscapular strengthening    PT Home Exercise Plan [email or web address]             Patient will benefit from skilled therapeutic intervention in order to improve the following deficits and impairments:  Decreased range of motion, Impaired UE functional use, Hypermobility, Pain, Hypomobility, Impaired flexibility, Decreased mobility, Decreased strength, Postural dysfunction  Visit Diagnosis: Cervicalgia  Stiffness of cervical spine  Acute pain of left shoulder  Muscle weakness (generalized)  Chronic pain of left knee     Problem List Patient Active Problem List   Diagnosis Date Noted   Essential hypertension, benign 11/19/2020   Hypothyroidism (acquired) 11/19/2020   Depression 11/19/2020   Anxiety attack 11/19/2020   Gwendolyn Grant, PT, DPT, ATC 06/20/21 6:38 PM  [facility] [address] [place], [place], [address] Phone: [phone]   Fax:  [phone]  Name: Alyssa Vincent MRN: [id number] Date of Birth: 07-12-1970

## 2021-06-27 ENCOUNTER — Ambulatory Visit: Payer: 59

## 2021-06-27 ENCOUNTER — Other Ambulatory Visit: Payer: Self-pay

## 2021-06-27 DIAGNOSIS — M25562 Pain in left knee: Secondary | ICD-10-CM

## 2021-06-27 DIAGNOSIS — M25512 Pain in left shoulder: Secondary | ICD-10-CM | POA: Diagnosis not present

## 2021-06-27 DIAGNOSIS — M436 Torticollis: Secondary | ICD-10-CM | POA: Diagnosis not present

## 2021-06-27 DIAGNOSIS — G8929 Other chronic pain: Secondary | ICD-10-CM | POA: Diagnosis not present

## 2021-06-27 DIAGNOSIS — M542 Cervicalgia: Secondary | ICD-10-CM

## 2021-06-27 DIAGNOSIS — M222X2 Patellofemoral disorders, left knee: Secondary | ICD-10-CM | POA: Diagnosis not present

## 2021-06-27 DIAGNOSIS — M6281 Muscle weakness (generalized): Secondary | ICD-10-CM

## 2021-06-27 NOTE — Therapy (Signed)
[place], [place], [address] Phone: [phone]   Fax:  [phone]  Physical Therapy Treatment  Patient Details  Name: Alyssa Vincent MRN: [id number] Date of Birth: 01-Jul-1970 Referring Provider (PT): Gregor Hams, MD for knee; Patricia Nettle, NP for neck   Encounter Date: 06/27/2021   PT End of Session - 06/27/21 1731     Visit Number 11    Number of Visits 19    Date for PT Re-Evaluation 07/20/21    Authorization Type [employer] VL 60    Authorization Time Period FOTO v6, v10    PT Start Time 1735    PT Stop Time 9211    PT Time Calculation (min) 39 min    Activity Tolerance Patient tolerated treatment well    Behavior During Therapy [facility] for tasks assessed/performed             Past Medical History:  Diagnosis Date   Anxiety    Anxiety attack 11/19/2020   Depression 11/19/2020   Essential hypertension, benign 11/19/2020   GERD (gastroesophageal reflux disease)    Hypertension    Hypothyroidism (acquired) 11/19/2020   Thyroid disease    hypothyroidism    Past Surgical History:  Procedure Laterality Date   BREAST SURGERY Bilateral 2006   Augmentation -saline   HERNIA REPAIR     Umbilical   TONSILLECTOMY      There were no vitals filed for this visit.   Subjective Assessment - 06/27/21 1732     Subjective Pt presents to PT with reports of neck and L knee pain.    Currently in Pain? Yes    Pain Score 5     Pain Location Knee    Pain Orientation Left    Pain Score 5    Pain Location Neck    Pain Orientation Posterior           [facility] Adult PT Treatment/Exercise:   Therapeutic Exercise: NuStep lvl 4 UE/LE x 4 min while taking subjective LAQ 2x10 2.5lb Supine SLR 2x10 ea S/L clamshell 2x10 GTB Supine chin tuck x 10 - 5" Seated cervical retraction x 10  Row 3x12 BTB    Manual Therapy: Suboccipital release  Manual cervical  traction                               PT Short Term Goals - 06/05/21 0858       PT SHORT TERM GOAL #1   Title Pt will report understanding and adherence to her HEP in order to promote independence in the management of her sxs.    Baseline HEP provided at eval.    Time 4    Period Weeks    Status Achieved    Target Date 05/08/21      PT SHORT TERM GOAL #2   Title Pt will be independant with initial HEP for knee to improve pain management.    Baseline issued at eval    Status New    Target Date 06/26/21      PT SHORT TERM GOAL #3   Title Pt will be able to demonstrate recipricol gait pattern on stairs without knee pain to improve function with occupational tasks.    Baseline unable    Status New    Target Date 06/26/21               PT Long Term Goals -  06/05/21 0905       PT LONG TERM GOAL #1   Title Pt will achieve 62% FOTO score in order to demonstrate improved functional ability as it relates to her neck pain.    Baseline 46%    Time 8    Period Weeks    Status On-going    Target Date 06/05/21      PT LONG TERM GOAL #2   Title Pt will achieve BIL cervical rotation of 65 degrees or greater with 0-4/10 pain in order to turn her head when driving without limitation.    Baseline See flowsheet    Time 8    Period Weeks    Status Achieved    Target Date 06/05/21      PT LONG TERM GOAL #3   Title Pt will achieve WNL BIL shoulder elevation AROM with 0-4/10 pain in order to reach into cabinets without limitation.    Baseline See flowsheet    Time 8    Period Weeks    Status Deferred    Target Date 06/05/21      PT LONG TERM GOAL #4   Title Pt will achieve BIL global parascapular strength of 4+/5 in order to promote long-term healthy cervical/ scapular positioning.    Baseline 3+/5    Time 8    Period Weeks    Status On-going    Target Date 06/05/21      PT LONG TERM GOAL #5   Title Pt will achieve cervical flexion and extension  AROM of 35 degrees or greater in order to get dressed without limitation.    Baseline 25 degrees each    Time 8    Period Weeks    Status On-going    Target Date 06/05/21      Additional Long Term Goals   Additional Long Term Goals Yes      PT LONG TERM GOAL #6   Title Pt will be able to achieve 61% predicted function on FOTO for knee to indicate clinically meaningful improvement of function.    Baseline see flowsheet    Status New    Target Date 07/17/21      PT LONG TERM GOAL #7   Title Pt will be able to achieve 4+/5 strength on all Lt hip and knee MMTs to improve knee stability during standing and walking.    Baseline see flowsheet    Status New    Target Date 07/17/21      PT LONG TERM GOAL #8   Title Pt will be able to demonstrate proper squat mechanics without pain to improve transfers and occupational tasks.    Baseline unable    Status New    Target Date 07/17/21                   Plan - 06/27/21 1817     Clinical Impression Statement Pt was able to complete all prescribed exercises with no adverse effect. Noted improvement in status post manual, with decreased pain noted post session. Therapy today also focused on improving quad and proximal hip strength to decrease knee pain. She continues to benefit from skilled PT services working on improving quad and proximal hip strength as well as DNF endurance and periscapular function. Will continue to progress as tolerated.    PT Treatment/Interventions ADLs/Self Care Home Management;Moist Heat;Biofeedback;Cryotherapy;Neuromuscular re-education;Therapeutic exercise;Therapeutic activities;Balance training;Functional mobility training;Stair training;Gait training;Taping;Manual techniques;Dry needling;Passive range of motion;Patient/family education;Electrical Stimulation;Iontophoresis 4mg /ml Dexamethasone;Vasopneumatic Device    PT Next  Visit Plan , taping for patellar maltracking, consider ionto and manual for pain  management. quad strengthening, periscapular strengthening    PT Home Exercise Plan [id number]             Patient will benefit from skilled therapeutic intervention in order to improve the following deficits and impairments:  Decreased range of motion, Impaired UE functional use, Hypermobility, Pain, Hypomobility, Impaired flexibility, Decreased mobility, Decreased strength, Postural dysfunction  Visit Diagnosis: Cervicalgia  Stiffness of cervical spine  Acute pain of left shoulder  Muscle weakness (generalized)  Chronic pain of left knee     Problem List Patient Active Problem List   Diagnosis Date Noted   Essential hypertension, benign 11/19/2020   Hypothyroidism (acquired) 11/19/2020   Depression 11/19/2020   Anxiety attack 11/19/2020    Ward Chatters, PT 06/27/2021, 6:23 PM  [facility] [address] [place], [place], [address] Phone: [phone]   Fax:  [phone]  Name: Gwenlyn Hottinger MRN: [id number] Date of Birth: 10-12-1970

## 2021-07-02 ENCOUNTER — Other Ambulatory Visit: Payer: Self-pay

## 2021-07-02 ENCOUNTER — Ambulatory Visit: Payer: 59 | Attending: Family Medicine

## 2021-07-02 DIAGNOSIS — G8929 Other chronic pain: Secondary | ICD-10-CM | POA: Diagnosis present

## 2021-07-02 DIAGNOSIS — M25512 Pain in left shoulder: Secondary | ICD-10-CM | POA: Insufficient documentation

## 2021-07-02 DIAGNOSIS — M542 Cervicalgia: Secondary | ICD-10-CM | POA: Diagnosis present

## 2021-07-02 DIAGNOSIS — M6281 Muscle weakness (generalized): Secondary | ICD-10-CM | POA: Diagnosis present

## 2021-07-02 DIAGNOSIS — M25562 Pain in left knee: Secondary | ICD-10-CM | POA: Insufficient documentation

## 2021-07-02 DIAGNOSIS — M436 Torticollis: Secondary | ICD-10-CM | POA: Insufficient documentation

## 2021-07-02 NOTE — Therapy (Signed)
[place], [place], [address] Phone: [phone]   Fax:  [phone]  Physical Therapy Treatment  Patient Details  Name: Alyssa Vincent MRN: [id number] Date of Birth: 10/11/1970 Referring Provider (PT): Gregor Hams, MD for knee; Patricia Nettle, NP for neck   Encounter Date: 07/02/2021   PT End of Session - 07/02/21 1748     Visit Number 12    Number of Visits 19    Date for PT Re-Evaluation 07/20/21    Authorization Type [employer]et[employer] VL 60    Authorization Time Period FOTO v6, v10    PT Start Time 1747    PT Stop Time 8546    PT Time Calculation (min) 43 min    Activity Tolerance Patient tolerated treatment well    Behavior During Therapy [facility] for tasks assessed/performed             Past Medical History:  Diagnosis Date   Anxiety    Anxiety attack 11/19/2020   Depression 11/19/2020   Essential hypertension, benign 11/19/2020   GERD (gastroesophageal reflux disease)    Hypertension    Hypothyroidism (acquired) 11/19/2020   Thyroid disease    hypothyroidism    Past Surgical History:  Procedure Laterality Date   BREAST SURGERY Bilateral 2006   Augmentation -saline   HERNIA REPAIR     Umbilical   TONSILLECTOMY      There were no vitals filed for this visit.   Subjective Assessment - 07/02/21 1749     Subjective Patient reports because of the rain the knee and the neck are a "little more off," but both are better than last time.    Currently in Pain? Yes    Pain Score 6     Pain Location Knee    Pain Orientation Left    Pain Descriptors / Indicators Sore    Pain Type Chronic pain    Pain Onset More than a month ago    Pain Frequency Constant    Aggravating Factors  weightbearing    Pain Relieving Factors brace    Pain Score 5    Pain Location Neck    Pain Orientation Posterior    Pain Descriptors / Indicators Tender    Pain Type Chronic pain    Pain Onset More than a month ago     Pain Frequency Constant    Aggravating Factors  unknown    Pain Relieving Factors heat                [facility] PT Assessment - 07/02/21 0001       AROM   Overall AROM Comments Lt knee flexion AROM 114             [facility] Adult PT Treatment/Exercise:   Therapeutic Exercise: Bodyweight squat 2 x 5  Leg press LLE eccentric attempted, increased pain Leg press with red band around thighs to active hip abduction 1 x 10 @ 20 lbs, 2 x 10 @ 40 lbs  Lateral band walks red band at shins 2  x10 ft, blue band at shins 4 x 10 ft  Sidelying leg taps fwd/bwd 1 x 10 LLE  Updated HEP Manual Therapy: McConnell tape to Lt knee to assist in patella alignment                              PT Short Term Goals - 06/05/21 2703  PT SHORT TERM GOAL #1   Title Pt will report understanding and adherence to her HEP in order to promote independence in the management of her sxs.    Baseline HEP provided at eval.    Time 4    Period Weeks    Status Achieved    Target Date 05/08/21      PT SHORT TERM GOAL #2   Title Pt will be independant with initial HEP for knee to improve pain management.    Baseline issued at eval    Status New    Target Date 06/26/21      PT SHORT TERM GOAL #3   Title Pt will be able to demonstrate recipricol gait pattern on stairs without knee pain to improve function with occupational tasks.    Baseline unable    Status New    Target Date 06/26/21               PT Long Term Goals - 06/05/21 0905       PT LONG TERM GOAL #1   Title Pt will achieve 62% FOTO score in order to demonstrate improved functional ability as it relates to her neck pain.    Baseline 46%    Time 8    Period Weeks    Status On-going    Target Date 06/05/21      PT LONG TERM GOAL #2   Title Pt will achieve BIL cervical rotation of 65 degrees or greater with 0-4/10 pain in order to turn her head when driving without limitation.    Baseline See flowsheet     Time 8    Period Weeks    Status Achieved    Target Date 06/05/21      PT LONG TERM GOAL #3   Title Pt will achieve WNL BIL shoulder elevation AROM with 0-4/10 pain in order to reach into cabinets without limitation.    Baseline See flowsheet    Time 8    Period Weeks    Status Deferred    Target Date 06/05/21      PT LONG TERM GOAL #4   Title Pt will achieve BIL global parascapular strength of 4+/5 in order to promote long-term healthy cervical/ scapular positioning.    Baseline 3+/5    Time 8    Period Weeks    Status On-going    Target Date 06/05/21      PT LONG TERM GOAL #5   Title Pt will achieve cervical flexion and extension AROM of 35 degrees or greater in order to get dressed without limitation.    Baseline 25 degrees each    Time 8    Period Weeks    Status On-going    Target Date 06/05/21      Additional Long Term Goals   Additional Long Term Goals Yes      PT LONG TERM GOAL #6   Title Pt will be able to achieve 61% predicted function on FOTO for knee to indicate clinically meaningful improvement of function.    Baseline see flowsheet    Status New    Target Date 07/17/21      PT LONG TERM GOAL #7   Title Pt will be able to achieve 4+/5 strength on all Lt hip and knee MMTs to improve knee stability during standing and walking.    Baseline see flowsheet    Status New    Target Date 07/17/21      PT LONG TERM  GOAL #8   Title Pt will be able to demonstrate proper squat mechanics without pain to improve transfers and occupational tasks.    Baseline unable    Status New    Target Date 07/17/21                   Plan - 07/02/21 1810     Clinical Impression Statement Taping performed at beginning of session to assist in improving patella alignment. She reported improved stability with squatting following taping, though no change in pain. Attempted eccentric LLE leg press, though this was discontinued due to increased anterior knee pain. Utilized  resistance band to promote hip abduction during DL leg press with patient reporting no anterior knee when patient engages hip abductors. Her Lt knee flexion AROM has improved compared to last assessment, though continues to remain limited.    PT Treatment/Interventions ADLs/Self Care Home Management;Moist Heat;Biofeedback;Cryotherapy;Neuromuscular re-education;Therapeutic exercise;Therapeutic activities;Balance training;Functional mobility training;Stair training;Gait training;Taping;Manual techniques;Dry needling;Passive range of motion;Patient/family education;Electrical Stimulation;Iontophoresis 4mg /ml Dexamethasone;Vasopneumatic Device    PT Next Visit Plan , taping for patellar maltracking, HIP ABDUCTOR/EXTENSOR STRENGTH!,consider ionto and manual for pain management. quad strengthening, periscapular strengthening    PT Home Exercise Plan [id number]    [id number] and Agree with Plan of Care Patient             Patient will benefit from skilled therapeutic intervention in order to improve the following deficits and impairments:  Decreased range of motion, Impaired UE functional use, Hypermobility, Pain, Hypomobility, Impaired flexibility, Decreased mobility, Decreased strength, Postural dysfunction  Visit Diagnosis: Cervicalgia  Stiffness of cervical spine  Muscle weakness (generalized)  Chronic pain of left knee     Problem List Patient Active Problem List   Diagnosis Date Noted   Essential hypertension, benign 11/19/2020   Hypothyroidism (acquired) 11/19/2020   Depression 11/19/2020   Anxiety attack 11/19/2020   Gwendolyn Grant, PT, DPT, ATC 07/02/21 6:37 PM  [facility] [address] [place], [place], [address] Phone: [phone]   Fax:  [phone]  Name: Alyssa Vincent MRN: [id number] Date of Birth: 05-Nov-1970

## 2021-07-04 ENCOUNTER — Ambulatory Visit: Payer: 59

## 2021-07-04 ENCOUNTER — Other Ambulatory Visit: Payer: Self-pay

## 2021-07-04 DIAGNOSIS — M542 Cervicalgia: Secondary | ICD-10-CM | POA: Diagnosis not present

## 2021-07-04 DIAGNOSIS — G8929 Other chronic pain: Secondary | ICD-10-CM

## 2021-07-04 DIAGNOSIS — M436 Torticollis: Secondary | ICD-10-CM

## 2021-07-04 DIAGNOSIS — M6281 Muscle weakness (generalized): Secondary | ICD-10-CM

## 2021-07-04 NOTE — Therapy (Signed)
[place], [place], [address] Phone: [phone]   Fax:  [phone]  Physical Therapy Treatment  Patient Details  Name: Alyssa Vincent MRN: [id number] Date of Birth: 01/30/1971 Referring Provider (PT): Gregor Hams, MD for knee; Patricia Nettle, NP for neck   Encounter Date: 07/04/2021   PT End of Session - 07/04/21 1746     Visit Number 13    Number of Visits 19    Date for PT Re-Evaluation 07/20/21    Authorization Type [employer] VL [id number]    Authorization Time Period FOTO v6, v10    PT Start Time 1746    PT Stop Time 1826    PT Time Calculation (min) 40 min    Activity Tolerance Patient tolerated treatment well    Behavior During Therapy [facility] for tasks assessed/performed             Past Medical History:  Diagnosis Date   Anxiety    Anxiety attack 11/19/2020   Depression 11/19/2020   Essential hypertension, benign 11/19/2020   GERD (gastroesophageal reflux disease)    Hypertension    Hypothyroidism (acquired) 11/19/2020   Thyroid disease    hypothyroidism    Past Surgical History:  Procedure Laterality Date   BREAST SURGERY Bilateral 2006   Augmentation -saline   HERNIA REPAIR     Umbilical   TONSILLECTOMY      There were no vitals filed for this visit.   Subjective Assessment - 07/04/21 1746     Subjective Pt presents to PT with reports of slight decrease in knee pain. Has been fairly compliant with HEP with no adverse effect. Pt is ready to begin PT treatment at this time.    Currently in Pain? Yes    Pain Score 5     Pain Location Knee    Pain Orientation Left           [facility] Adult PT Treatment/Exercise:   Therapeutic Exercise: NuStep LE only lvl 6 x 4 min while taking subjective Bodyweight squat 2 x 5  Standing hip abd/ext 2x10 RTB Leg press LLE eccentric attempted, increased pain Leg press with red band around thighs 2x10 55lb Lateral band walks red band at shins 3x10  ft Supine SLR pulse reps 2x15" ea S/L hip abd 2x15 ea Bridge 3x10 Blue TB around knees S/L clamshell 2x15 BTB STS 2x10 Blue TB around knees - no UE  Total gym hamstring curl 3x10 35lb Manual Therapy: N/A                               PT Short Term Goals - 06/05/21 0858       PT SHORT TERM GOAL #1   Title Pt will report understanding and adherence to her HEP in order to promote independence in the management of her sxs.    Baseline HEP provided at eval.    Time 4    Period Weeks    Status Achieved    Target Date 05/08/21      PT SHORT TERM GOAL #2   Title Pt will be independant with initial HEP for knee to improve pain management.    Baseline issued at eval    Status New    Target Date 06/26/21      PT SHORT TERM GOAL #3   Title Pt will be able to demonstrate recipricol gait pattern on stairs without knee pain  to improve function with occupational tasks.    Baseline unable    Status New    Target Date 06/26/21               PT Long Term Goals - 06/05/21 0905       PT LONG TERM GOAL #1   Title Pt will achieve 62% FOTO score in order to demonstrate improved functional ability as it relates to her neck pain.    Baseline 46%    Time 8    Period Weeks    Status On-going    Target Date 06/05/21      PT LONG TERM GOAL #2   Title Pt will achieve BIL cervical rotation of 65 degrees or greater with 0-4/10 pain in order to turn her head when driving without limitation.    Baseline See flowsheet    Time 8    Period Weeks    Status Achieved    Target Date 06/05/21      PT LONG TERM GOAL #3   Title Pt will achieve WNL BIL shoulder elevation AROM with 0-4/10 pain in order to reach into cabinets without limitation.    Baseline See flowsheet    Time 8    Period Weeks    Status Deferred    Target Date 06/05/21      PT LONG TERM GOAL #4   Title Pt will achieve BIL global parascapular strength of 4+/5 in order to promote long-term healthy  cervical/ scapular positioning.    Baseline 3+/5    Time 8    Period Weeks    Status On-going    Target Date 06/05/21      PT LONG TERM GOAL #5   Title Pt will achieve cervical flexion and extension AROM of 35 degrees or greater in order to get dressed without limitation.    Baseline 25 degrees each    Time 8    Period Weeks    Status On-going    Target Date 06/05/21      Additional Long Term Goals   Additional Long Term Goals Yes      PT LONG TERM GOAL #6   Title Pt will be able to achieve 61% predicted function on FOTO for knee to indicate clinically meaningful improvement of function.    Baseline see flowsheet    Status New    Target Date 07/17/21      PT LONG TERM GOAL #7   Title Pt will be able to achieve 4+/5 strength on all Lt hip and knee MMTs to improve knee stability during standing and walking.    Baseline see flowsheet    Status New    Target Date 07/17/21      PT LONG TERM GOAL #8   Title Pt will be able to demonstrate proper squat mechanics without pain to improve transfers and occupational tasks.    Baseline unable    Status New    Target Date 07/17/21                   Plan - 07/04/21 1751     Clinical Impression Statement Pt was able to complete prescribed exercises with no adverse effect or change in baseline. Therapy today focused on increasing LE strength, with particular emphasis on proximal hip. She continues to benefit from skilled PT services with continued progression of proximal hip strengthening as able. Will continue to progress as tolerated per POC.    PT Treatment/Interventions ADLs/Self Care Home  Management;Moist Heat;Biofeedback;Cryotherapy;Neuromuscular re-education;Therapeutic exercise;Therapeutic activities;Balance training;Functional mobility training;Stair training;Gait training;Taping;Manual techniques;Dry needling;Passive range of motion;Patient/family education;Electrical Stimulation;Iontophoresis 4mg /ml  Dexamethasone;Vasopneumatic Device    PT Next Visit Plan , taping for patellar maltracking, HIP ABDUCTOR/EXTENSOR STRENGTH!,consider ionto and manual for pain management. quad strengthening, periscapular strengthening    PT Home Exercise Plan [id number]             Patient will benefit from skilled therapeutic intervention in order to improve the following deficits and impairments:  Decreased range of motion, Impaired UE functional use, Hypermobility, Pain, Hypomobility, Impaired flexibility, Decreased mobility, Decreased strength, Postural dysfunction  Visit Diagnosis: Cervicalgia  Stiffness of cervical spine  Muscle weakness (generalized)  Chronic pain of left knee     Problem List Patient Active Problem List   Diagnosis Date Noted   Essential hypertension, benign 11/19/2020   Hypothyroidism (acquired) 11/19/2020   Depression 11/19/2020   Anxiety attack 11/19/2020    Ward Chatters, PT 07/04/2021, 6:30 PM  [facility] [address] [place], [place], [address] Phone: [phone]   Fax:  [phone]  Name: Alyssa Vincent MRN: [id number] Date of Birth: 1971-05-13

## 2021-07-09 ENCOUNTER — Ambulatory Visit: Payer: 59

## 2021-07-09 ENCOUNTER — Other Ambulatory Visit: Payer: Self-pay

## 2021-07-09 DIAGNOSIS — G8929 Other chronic pain: Secondary | ICD-10-CM

## 2021-07-09 DIAGNOSIS — M436 Torticollis: Secondary | ICD-10-CM

## 2021-07-09 DIAGNOSIS — M542 Cervicalgia: Secondary | ICD-10-CM | POA: Diagnosis not present

## 2021-07-09 DIAGNOSIS — M25562 Pain in left knee: Secondary | ICD-10-CM

## 2021-07-09 DIAGNOSIS — M6281 Muscle weakness (generalized): Secondary | ICD-10-CM

## 2021-07-09 NOTE — Therapy (Signed)
[place], [place], [address] Phone: [phone]   Fax:  [phone]  Physical Therapy Treatment  Patient Details  Name: Alyssa Vincent MRN: [id number] Date of Birth: October 12, 1970 Referring Provider (PT): Gregor Hams, MD for knee; Patricia Nettle, NP for neck   Encounter Date: 07/09/2021   PT End of Session - 07/09/21 1749     Visit Number 14    Number of Visits 19    Date for PT Re-Evaluation 07/20/21    Authorization Type [employer] VL 60    Authorization Time Period FOTO v6, v10    PT Start Time 7622    PT Stop Time 1840    PT Time Calculation (min) 52 min    Activity Tolerance Patient tolerated treatment well    Behavior During Therapy [facility] for tasks assessed/performed             Past Medical History:  Diagnosis Date   Anxiety    Anxiety attack 11/19/2020   Depression 11/19/2020   Essential hypertension, benign 11/19/2020   GERD (gastroesophageal reflux disease)    Hypertension    Hypothyroidism (acquired) 11/19/2020   Thyroid disease    hypothyroidism    Past Surgical History:  Procedure Laterality Date   BREAST SURGERY Bilateral 2006   Augmentation -saline   HERNIA REPAIR     Umbilical   TONSILLECTOMY      There were no vitals filed for this visit.   Subjective Assessment - 07/09/21 1754     Subjective Patient reports the back of her head has been bothering her the past couple days. She continues to report tightness in bilateral upper traps. She is having low back pain currently, but this is a chronic issue for her. She reports the Lt knee continues to have intermittent soreness. She does feel that both her neck and her knee pain are gradually improving since starting care.    Currently in Pain? Yes    Pain Score 5     Pain Location Neck    Pain Orientation Posterior    Pain Descriptors / Indicators Tightness    Pain Type Chronic pain    Pain Onset More than a month ago    Pain  Frequency Intermittent    Aggravating Factors  prolonged activity and sustained positioning    Pain Relieving Factors dry needling                [facility] PT Assessment - 07/09/21 0001       AROM   Cervical Flexion 31   41 post traction   Cervical Extension 55           [facility] Adult PT Treatment/Exercise:   Therapeutic Exercise: Prone T 2 x 10  Prone W 2 x 10 Prone row 2 x 10 bilateral 4#  Standing resisted extension 2 x 15 green band Standing Row 2 x 15 black band Updated HEP  Manual Therapy: DTM/ trigger point release to bilateral upper traps.  Suboccipital release Manual cervical distraction CPAs Grade II-III C3-T3  Cervical Traction: 10 minutes Max 15 lbs Min 5 lbs intermittent                            PT Short Term Goals - 07/09/21 1809       PT SHORT TERM GOAL #1   Title Pt will report understanding and adherence to her HEP in order to promote independence  in the management of her sxs.    Baseline HEP provided at eval.    Time 4    Period Weeks    Status Achieved    Target Date 05/08/21      PT SHORT TERM GOAL #2   Title Pt will be independant with initial HEP for knee to improve pain management.    Baseline issued at eval    Status Achieved    Target Date 06/26/21      PT SHORT TERM GOAL #3   Title Pt will be able to demonstrate recipricol gait pattern on stairs without knee pain to improve function with occupational tasks.    Baseline can complete reciprocal ascent and descent, pain with descent    Status Partially Met    Target Date 06/26/21               PT Long Term Goals - 06/05/21 0905       PT LONG TERM GOAL #1   Title Pt will achieve 62% FOTO score in order to demonstrate improved functional ability as it relates to her neck pain.    Baseline 46%    Time 8    Period Weeks    Status On-going    Target Date 06/05/21      PT LONG TERM GOAL #2   Title Pt will achieve BIL cervical rotation of 65 degrees  or greater with 0-4/10 pain in order to turn her head when driving without limitation.    Baseline See flowsheet    Time 8    Period Weeks    Status Achieved    Target Date 06/05/21      PT LONG TERM GOAL #3   Title Pt will achieve WNL BIL shoulder elevation AROM with 0-4/10 pain in order to reach into cabinets without limitation.    Baseline See flowsheet    Time 8    Period Weeks    Status Deferred    Target Date 06/05/21      PT LONG TERM GOAL #4   Title Pt will achieve BIL global parascapular strength of 4+/5 in order to promote long-term healthy cervical/ scapular positioning.    Baseline 3+/5    Time 8    Period Weeks    Status On-going    Target Date 06/05/21      PT LONG TERM GOAL #5   Title Pt will achieve cervical flexion and extension AROM of 35 degrees or greater in order to get dressed without limitation.    Baseline 25 degrees each    Time 8    Period Weeks    Status On-going    Target Date 06/05/21      Additional Long Term Goals   Additional Long Term Goals Yes      PT LONG TERM GOAL #6   Title Pt will be able to achieve 61% predicted function on FOTO for knee to indicate clinically meaningful improvement of function.    Baseline see flowsheet    Status New    Target Date 07/17/21      PT LONG TERM GOAL #7   Title Pt will be able to achieve 4+/5 strength on all Lt hip and knee MMTs to improve knee stability during standing and walking.    Baseline see flowsheet    Status New    Target Date 07/17/21      PT LONG TERM GOAL #8   Title Pt will be able to demonstrate proper squat  mechanics without pain to improve transfers and occupational tasks.    Baseline unable    Status New    Target Date 07/17/21                   Plan - 07/09/21 1811     Clinical Impression Statement Session focused on addressing her neck pain with manual therapy techniques, postural strengthening, and introduction to mechanical cervical traction. Her tautness and  palpable tenderness hsa returned in bilateral upper traps, though withheld TPDN tonight to determine effects of mechanical traction on her pain. Her cervical flexion and extension AROM have improved compared to last assessment with flexion remaining limited at this time. She tolerated ther ex well, though requires consistent cues to decrease excessive upper trap engagement. Cervical flexion ROM improved by 10 degrees following traction with patient reporting a decrease in neck pain rated as 3/10 at end of session.    PT Treatment/Interventions ADLs/Self Care Home Management;Moist Heat;Biofeedback;Cryotherapy;Neuromuscular re-education;Therapeutic exercise;Therapeutic activities;Balance training;Functional mobility training;Stair training;Gait training;Taping;Manual techniques;Dry needling;Passive range of motion;Patient/family education;Electrical Stimulation;Iontophoresis 88m/ml Dexamethasone;Vasopneumatic Device;Traction    PT Next Visit Plan , taping for patellar maltracking, HIP ABDUCTOR/EXTENSOR STRENGTH!,consider ionto and manual for pain management. quad strengthening, periscapular strengthening    PT Home Exercise Plan [id number]    [id number] Agree with Plan of Care Patient             Patient will benefit from skilled therapeutic intervention in order to improve the following deficits and impairments:  Decreased range of motion, Impaired UE functional use, Hypermobility, Pain, Hypomobility, Impaired flexibility, Decreased mobility, Decreased strength, Postural dysfunction  Visit Diagnosis: Cervicalgia - Plan: PT plan of care cert/re-cert  Muscle weakness (generalized) - Plan: PT plan of care cert/re-cert  Chronic pain of left knee - Plan: PT plan of care cert/re-cert  Stiffness of cervical spine - Plan: PT plan of care cert/re-cert     Problem List Patient Active Problem List   Diagnosis Date Noted   Essential hypertension, benign 11/19/2020   Hypothyroidism (acquired)  11/19/2020   Depression 11/19/2020   Anxiety attack 11/19/2020   SGwendolyn Grant PT, DPT, ATC 07/09/21 6:43 PM  [facility]GAd[address] [address]Phone: [phone]  Fax:  [phone] Name: LJordayn MinkMRN: [id number] of Birth: 407/31/1972

## 2021-07-11 ENCOUNTER — Other Ambulatory Visit: Payer: Self-pay

## 2021-07-11 ENCOUNTER — Ambulatory Visit: Payer: 59

## 2021-07-11 DIAGNOSIS — M436 Torticollis: Secondary | ICD-10-CM

## 2021-07-11 DIAGNOSIS — G8929 Other chronic pain: Secondary | ICD-10-CM

## 2021-07-11 DIAGNOSIS — M6281 Muscle weakness (generalized): Secondary | ICD-10-CM

## 2021-07-11 DIAGNOSIS — M542 Cervicalgia: Secondary | ICD-10-CM | POA: Diagnosis not present

## 2021-07-11 DIAGNOSIS — M25562 Pain in left knee: Secondary | ICD-10-CM

## 2021-07-11 DIAGNOSIS — M25512 Pain in left shoulder: Secondary | ICD-10-CM

## 2021-07-11 NOTE — Therapy (Signed)
[place], [place], [address] Phone: [phone]   Fax:  [phone]  Physical Therapy Treatment  Patient Details  Name: Alyssa Vincent MRN: [id number] Date of Birth: [birth year] Referring Provider (PT): Gregor Hams, MD for knee; Patricia Nettle, NP for neck   Encounter Date: 07/11/2021   PT End of Session - 07/11/21 1745     Visit Number 15    Number of Visits 19    Date for PT Re-Evaluation 07/20/21    Authorization Type [employer] VL 60    Authorization Time Period FOTO v6, v10    PT Start Time 1745    PT Stop Time 9371    PT Time Calculation (min) 42 min    Activity Tolerance Patient tolerated treatment well    Behavior During Therapy [facility] for tasks assessed/performed             Past Medical History:  Diagnosis Date   Anxiety    Anxiety attack 11/19/2020   Depression 11/19/2020   Essential hypertension, benign 11/19/2020   GERD (gastroesophageal reflux disease)    Hypertension    Hypothyroidism (acquired) 11/19/2020   Thyroid disease    hypothyroidism    Past Surgical History:  Procedure Laterality Date   BREAST SURGERY Bilateral 2006   Augmentation -saline   HERNIA REPAIR     Umbilical   TONSILLECTOMY      There were no vitals filed for this visit.   Subjective Assessment - 07/11/21 1745     Subjective Pt presents to PT with reports of decreased neck pain, but does have continued knee pain and discomfort. She has continued to be compliant with her HEP with no adverse effect. She is ready to begin PT at this time.    Currently in Pain? Yes    Pain Score 5     Pain Location Knee    Pain Orientation Left           [facility] Adult PT Treatment/Exercise:   Therapeutic Exercise: NuStep LE only lvl 6 x 4 min while taking subjective Lateral band walks red band at shins 3x15 ft Bridge 3x15 Blue TB around knees S/L clamshell 2x15 BTB STS 2x10 Blue TB around knees - no UE Prone quad  stretch 2x45"  Standing hip abd/ext 2x10 30lb LAQ 2x15 5lb Total gym hamstring curl 3x10 35lb  Not Today: Supine SLR pulse reps 2x15" ea  Manual Therapy: N/A                               PT Short Term Goals - 07/09/21 1809       PT SHORT TERM GOAL #1   Title Pt will report understanding and adherence to her HEP in order to promote independence in the management of her sxs.    Baseline HEP provided at eval.    Time 4    Period Weeks    Status Achieved    Target Date 05/08/21      PT SHORT TERM GOAL #2   Title Pt will be independant with initial HEP for knee to improve pain management.    Baseline issued at eval    Status Achieved    Target Date 06/26/21      PT SHORT TERM GOAL #3   Title Pt will be able to demonstrate recipricol gait pattern on stairs without knee pain to improve function with occupational tasks.  Baseline can complete reciprocal ascent and descent, pain with descent    Status Partially Met    Target Date 06/26/21               PT Long Term Goals - 06/05/21 0905       PT LONG TERM GOAL #1   Title Pt will achieve 62% FOTO score in order to demonstrate improved functional ability as it relates to her neck pain.    Baseline 46%    Time 8    Period Weeks    Status On-going    Target Date 06/05/21      PT LONG TERM GOAL #2   Title Pt will achieve BIL cervical rotation of 65 degrees or greater with 0-4/10 pain in order to turn her head when driving without limitation.    Baseline See flowsheet    Time 8    Period Weeks    Status Achieved    Target Date 06/05/21      PT LONG TERM GOAL #3   Title Pt will achieve WNL BIL shoulder elevation AROM with 0-4/10 pain in order to reach into cabinets without limitation.    Baseline See flowsheet    Time 8    Period Weeks    Status Deferred    Target Date 06/05/21      PT LONG TERM GOAL #4   Title Pt will achieve BIL global parascapular strength of 4+/5 in order to  promote long-term healthy cervical/ scapular positioning.    Baseline 3+/5    Time 8    Period Weeks    Status On-going    Target Date 06/05/21      PT LONG TERM GOAL #5   Title Pt will achieve cervical flexion and extension AROM of 35 degrees or greater in order to get dressed without limitation.    Baseline 25 degrees each    Time 8    Period Weeks    Status On-going    Target Date 06/05/21      Additional Long Term Goals   Additional Long Term Goals Yes      PT LONG TERM GOAL #6   Title Pt will be able to achieve 61% predicted function on FOTO for knee to indicate clinically meaningful improvement of function.    Baseline see flowsheet    Status New    Target Date 07/17/21      PT LONG TERM GOAL #7   Title Pt will be able to achieve 4+/5 strength on all Lt hip and knee MMTs to improve knee stability during standing and walking.    Baseline see flowsheet    Status New    Target Date 07/17/21      PT LONG TERM GOAL #8   Title Pt will be able to demonstrate proper squat mechanics without pain to improve transfers and occupational tasks.    Baseline unable    Status New    Target Date 07/17/21                   Plan - 07/11/21 1827     Clinical Impression Statement Pt was able to complete all prescribed exercises with no adverse effect. Therapy today focused on improving quad and proximal hip muscle strength in order to decrease pain and improving functional mobility. Continued emphasis on activiting hip abductors, reducing stress on bilateral patella. She continues to benefit from skilled PT services and will continue to be seen and progressed as tolerated.  PT Treatment/Interventions ADLs/Self Care Home Management;Moist Heat;Biofeedback;Cryotherapy;Neuromuscular re-education;Therapeutic exercise;Therapeutic activities;Balance training;Functional mobility training;Stair training;Gait training;Taping;Manual techniques;Dry needling;Passive range of  motion;Patient/family education;Electrical Stimulation;Iontophoresis 43m/ml Dexamethasone;Vasopneumatic Device;Traction    PT Next Visit Plan , taping for patellar maltracking, HIP ABDUCTOR/EXTENSOR STRENGTH!,consider ionto and manual for pain management. quad strengthening, periscapular strengthening    PT Home Exercise Plan [id number]            Patient will benefit from skilled therapeutic intervention in order to improve the following deficits and impairments:  Decreased range of motion, Impaired UE functional use, Hypermobility, Pain, Hypomobility, Impaired flexibility, Decreased mobility, Decreased strength, Postural dysfunction  Visit Diagnosis: Cervicalgia  Muscle weakness (generalized)  Chronic pain of left knee  Stiffness of cervical spine  Acute pain of left shoulder     Problem List Patient Active Problem List   Diagnosis Date Noted   Essential hypertension, benign 11/19/2020   Hypothyroidism (acquired) 11/19/2020   Depression 11/19/2020   Anxiety attack 11/19/2020    Alyssa Vincent PT 07/11/2021, 6:37 PM  [facility] [address]Phone: [phone]  Fax:  [phone] Name: Alyssa ShiromaMRN: [id number] of Birth: [birth year]

## 2021-07-16 ENCOUNTER — Other Ambulatory Visit: Payer: Self-pay

## 2021-07-16 ENCOUNTER — Ambulatory Visit: Payer: 59

## 2021-07-16 DIAGNOSIS — M542 Cervicalgia: Secondary | ICD-10-CM

## 2021-07-16 DIAGNOSIS — M6281 Muscle weakness (generalized): Secondary | ICD-10-CM

## 2021-07-16 DIAGNOSIS — M25562 Pain in left knee: Secondary | ICD-10-CM

## 2021-07-16 DIAGNOSIS — G8929 Other chronic pain: Secondary | ICD-10-CM

## 2021-07-16 NOTE — Therapy (Signed)
[place], [place], [address] Phone: [phone]   Fax:  [phone]  Physical Therapy Treatment  Patient Details  Name: Alyssa Vincent MRN: [id number] Date of Birth: 1970/12/02 Referring Provider (PT): Gregor Hams, MD for knee; Patricia Nettle, NP for neck   Encounter Date: 07/16/2021   PT End of Session - 07/16/21 1745     Visit Number 16    Number of Visits 19    Date for PT Re-Evaluation 07/20/21    Authorization Type Aet[employer] VL [id number]    Authorization Time Period FOTO v6, v10    PT Start Time 1745    PT Stop Time 1828    PT Time Calculation (min) 43 min    Activity Tolerance Patient tolerated treatment well    Behavior During Therapy [facility] for tasks assessed/performed             Past Medical History:  Diagnosis Date   Anxiety    Anxiety attack 11/19/2020   Depression 11/19/2020   Essential hypertension, benign 11/19/2020   GERD (gastroesophageal reflux disease)    Hypertension    Hypothyroidism (acquired) 11/19/2020   Thyroid disease    hypothyroidism    Past Surgical History:  Procedure Laterality Date   BREAST SURGERY Bilateral 2006   Augmentation -saline   HERNIA REPAIR     Umbilical   TONSILLECTOMY      There were no vitals filed for this visit.   Subjective Assessment - 07/16/21 1748     Subjective Patient reports she is doing good. She reports the knee hurts when she touches it, but she is walking good and going up the stairs ok. Patient reports no neck pain currently.    Currently in Pain? No/denies             [facility] Adult PT Treatment/Exercise:   Therapeutic Exercise: 3 way hip on airex 2 x 10 bilateral  RDL with green band multiple trials, heavy cues Mini squat hold on bosu 4 x 30 sec TRX squat 2 x 10                                PT Short Term Goals - 07/09/21 1809       PT SHORT TERM GOAL #1   Title Pt will report understanding and  adherence to her HEP in order to promote independence in the management of her sxs.    Baseline HEP provided at eval.    Time 4    Period Weeks    Status Achieved    Target Date 05/08/21      PT SHORT TERM GOAL #2   Title Pt will be independant with initial HEP for knee to improve pain management.    Baseline issued at eval    Status Achieved    Target Date 06/26/21      PT SHORT TERM GOAL #3   Title Pt will be able to demonstrate recipricol gait pattern on stairs without knee pain to improve function with occupational tasks.    Baseline can complete reciprocal ascent and descent, pain with descent    Status Partially Met    Target Date 06/26/21               PT Long Term Goals - 06/05/21 0905       PT LONG TERM GOAL #1   Title Pt will achieve 62% FOTO  score in order to demonstrate improved functional ability as it relates to her neck pain.    Baseline 46%    Time 8    Period Weeks    Status On-going    Target Date 06/05/21      PT LONG TERM GOAL #2   Title Pt will achieve BIL cervical rotation of 65 degrees or greater with 0-4/10 pain in order to turn her head when driving without limitation.    Baseline See flowsheet    Time 8    Period Weeks    Status Achieved    Target Date 06/05/21      PT LONG TERM GOAL #3   Title Pt will achieve WNL BIL shoulder elevation AROM with 0-4/10 pain in order to reach into cabinets without limitation.    Baseline See flowsheet    Time 8    Period Weeks    Status Deferred    Target Date 06/05/21      PT LONG TERM GOAL #4   Title Pt will achieve BIL global parascapular strength of 4+/5 in order to promote long-term healthy cervical/ scapular positioning.    Baseline 3+/5    Time 8    Period Weeks    Status On-going    Target Date 06/05/21      PT LONG TERM GOAL #5   Title Pt will achieve cervical flexion and extension AROM of 35 degrees or greater in order to get dressed without limitation.    Baseline 25 degrees each     Time 8    Period Weeks    Status On-going    Target Date 06/05/21      Additional Long Term Goals   Additional Long Term Goals Yes      PT LONG TERM GOAL #6   Title Pt will be able to achieve 61% predicted function on FOTO for knee to indicate clinically meaningful improvement of function.    Baseline see flowsheet    Status New    Target Date 07/17/21      PT LONG TERM GOAL #7   Title Pt will be able to achieve 4+/5 strength on all Lt hip and knee MMTs to improve knee stability during standing and walking.    Baseline see flowsheet    Status New    Target Date 07/17/21      PT LONG TERM GOAL #8   Title Pt will be able to demonstrate proper squat mechanics without pain to improve transfers and occupational tasks.    Baseline unable    Status New    Target Date 07/17/21                   Plan - 07/16/21 1756     Clinical Impression Statement Patient arrives without reports of knee or neck pain. Session focused on progression of BLE strengthening, focusing mostly on gluteal strengthening to improve overall knee stability. She has difficulty sequencing proper movement of dead lift and this exercise was eventually terminated as she continued to report low back pain and inability to engage at the hips. No reports of knee pain with CKC strengthening, though occasional low back pain reported.    PT Treatment/Interventions ADLs/Self Care Home Management;Moist Heat;Biofeedback;Cryotherapy;Neuromuscular re-education;Therapeutic exercise;Therapeutic activities;Balance training;Functional mobility training;Stair training;Gait training;Taping;Manual techniques;Dry needling;Passive range of motion;Patient/family education;Electrical Stimulation;Iontophoresis 52m/ml Dexamethasone;Vasopneumatic Device;Traction    PT Next Visit Plan re-eval.    PT Home Exercise Plan [id number]   Consulted and Agree with Plan of  Care Patient             Patient will benefit from skilled therapeutic  intervention in order to improve the following deficits and impairments:  Decreased range of motion, Impaired UE functional use, Hypermobility, Pain, Hypomobility, Impaired flexibility, Decreased mobility, Decreased strength, Postural dysfunction  Visit Diagnosis: Cervicalgia  Muscle weakness (generalized)  Chronic pain of left knee     Problem List Patient Active Problem List   Diagnosis Date Noted   Essential hypertension, benign 11/19/2020   Hypothyroidism (acquired) 11/19/2020   Depression 11/19/2020   Anxiety attack 11/19/2020   Gwendolyn Grant, PT, DPT, ATC 07/16/21 6:30 PM   [facility] [address] [place], [place], [address] Phone: [phone]   Fax:  [phone]  Name: Alyssa Vincent MRN: [id number] Date of Birth: January 07, 1971

## 2021-07-18 ENCOUNTER — Ambulatory Visit: Payer: 59

## 2021-07-18 ENCOUNTER — Other Ambulatory Visit: Payer: Self-pay

## 2021-07-18 DIAGNOSIS — G8929 Other chronic pain: Secondary | ICD-10-CM

## 2021-07-18 DIAGNOSIS — M542 Cervicalgia: Secondary | ICD-10-CM | POA: Diagnosis not present

## 2021-07-18 DIAGNOSIS — M6281 Muscle weakness (generalized): Secondary | ICD-10-CM

## 2021-07-18 NOTE — Therapy (Signed)
[place], [place], [address] Phone: [phone]   Fax:  [phone]  Physical Therapy Treatment  Patient Details  Name: Alyssa Vincent MRN: [id number] Date of Birth: Mar 22, 1971 Referring Provider (PT): Gregor Hams, MD for knee; Patricia Nettle, NP for neck   Encounter Date: 07/18/2021   PT End of Session - 07/18/21 1745     Visit Number 17    Number of Visits 19    Date for PT Re-Evaluation 07/20/21    Authorization Type [employer] VL 60    Authorization Time Period FOTO v6, v10    PT Start Time 1745    PT Stop Time 1826    PT Time Calculation (min) 41 min    Activity Tolerance Patient tolerated treatment well    Behavior During Therapy [facility] for tasks assessed/performed             Past Medical History:  Diagnosis Date   Anxiety    Anxiety attack 11/19/2020   Depression 11/19/2020   Essential hypertension, benign 11/19/2020   GERD (gastroesophageal reflux disease)    Hypertension    Hypothyroidism (acquired) 11/19/2020   Thyroid disease    hypothyroidism    Past Surgical History:  Procedure Laterality Date   BREAST SURGERY Bilateral 2006   Augmentation -saline   HERNIA REPAIR     Umbilical   TONSILLECTOMY      There were no vitals filed for this visit.   Subjective Assessment - 07/18/21 1748     Subjective Patient reports her knee and neck are feeling ok, but she thinks she pulled a muscle in her back yesterday. She reports soreness in her hips after last session.    Currently in Pain? Yes    Pain Score 5     Pain Location Back    Pain Orientation Mid    Pain Descriptors / Indicators Tightness    Pain Type Acute pain    Pain Onset Yesterday    Pain Frequency Constant    Aggravating Factors  breathing    Pain Relieving Factors movement             [facility] Adult PT Treatment/Exercise:   Therapeutic Exercise: Prone quad stretch LLE 60 seconds  Resisted hip flexion on cybex 2 x  10 @ 25 lbs, bilateral  Resisted hip extension on cybex 2 x 10 @ 25 lbs, bilateral  Resisted hip abduction on cybex 2 x 10 @ 25 lbs, bilateral  Leg press DL 1 x 10 @ 40 lbs Eccentric leg press LLE 2 x 10 @ 40 lbs  Resisted hamstring curl 2 x 15 @ 25 lbs  Sidelying hip circles 2 x 10 CW/CCW LLE         [facility] PT Assessment - 07/18/21 0001       AROM   Overall AROM Comments Lt knee flexion 118                                      PT Short Term Goals - 07/18/21 1814       PT SHORT TERM GOAL #1   Title Pt will report understanding and adherence to her HEP in order to promote independence in the management of her sxs.    Baseline HEP provided at eval.    Time 4    Period Weeks    Status Achieved  Target Date 05/08/21      PT SHORT TERM GOAL #2   Title Pt will be independant with initial HEP for knee to improve pain management.    Baseline issued at eval    Status Achieved    Target Date 06/26/21      PT SHORT TERM GOAL #3   Title Pt will be able to demonstrate recipricol gait pattern on stairs without knee pain to improve function with occupational tasks.    Baseline patient reports ability to complete reciprocal stepping without pain. 07/18/21    Status Achieved    Target Date 06/26/21               PT Long Term Goals - 06/05/21 0905       PT LONG TERM GOAL #1   Title Pt will achieve 62% FOTO score in order to demonstrate improved functional ability as it relates to her neck pain.    Baseline 46%    Time 8    Period Weeks    Status On-going    Target Date 06/05/21      PT LONG TERM GOAL #2   Title Pt will achieve BIL cervical rotation of 65 degrees or greater with 0-4/10 pain in order to turn her head when driving without limitation.    Baseline See flowsheet    Time 8    Period Weeks    Status Achieved    Target Date 06/05/21      PT LONG TERM GOAL #3   Title Pt will achieve WNL BIL shoulder elevation AROM with 0-4/10 pain  in order to reach into cabinets without limitation.    Baseline See flowsheet    Time 8    Period Weeks    Status Deferred    Target Date 06/05/21      PT LONG TERM GOAL #4   Title Pt will achieve BIL global parascapular strength of 4+/5 in order to promote long-term healthy cervical/ scapular positioning.    Baseline 3+/5    Time 8    Period Weeks    Status On-going    Target Date 06/05/21      PT LONG TERM GOAL #5   Title Pt will achieve cervical flexion and extension AROM of 35 degrees or greater in order to get dressed without limitation.    Baseline 25 degrees each    Time 8    Period Weeks    Status On-going    Target Date 06/05/21      Additional Long Term Goals   Additional Long Term Goals Yes      PT LONG TERM GOAL #6   Title Pt will be able to achieve 61% predicted function on FOTO for knee to indicate clinically meaningful improvement of function.    Baseline see flowsheet    Status New    Target Date 07/17/21      PT LONG TERM GOAL #7   Title Pt will be able to achieve 4+/5 strength on all Lt hip and knee MMTs to improve knee stability during standing and walking.    Baseline see flowsheet    Status New    Target Date 07/17/21      PT LONG TERM GOAL #8   Title Pt will be able to demonstrate proper squat mechanics without pain to improve transfers and occupational tasks.    Baseline unable    Status New    Target Date 07/17/21  Plan - 07/18/21 1754     Clinical Impression Statement Patient continues to report overall improvements in her neck and knee pain and function. We continued to focus on LE strengthening with emphasis on hip strengthening, which she tolerated well. She did well with eccentric quadricep strengthening today demonstrating good form and control and no reports of pain. Her Lt knee flexion AROM continues to improve with patient reporting tightness at end range, but no pain.    PT Treatment/Interventions ADLs/Self  Care Home Management;Moist Heat;Biofeedback;Cryotherapy;Neuromuscular re-education;Therapeutic exercise;Therapeutic activities;Balance training;Functional mobility training;Stair training;Gait training;Taping;Manual techniques;Dry needling;Passive range of motion;Patient/family education;Electrical Stimulation;Iontophoresis 4mg /ml Dexamethasone;Vasopneumatic Device;Traction    PT Next Visit Plan re-eval.    PT Home Exercise Plan [id number]    [id number] and Agree with Plan of Care Patient             Patient will benefit from skilled therapeutic intervention in order to improve the following deficits and impairments:  Decreased range of motion, Impaired UE functional use, Hypermobility, Pain, Hypomobility, Impaired flexibility, Decreased mobility, Decreased strength, Postural dysfunction  Visit Diagnosis: Cervicalgia  Muscle weakness (generalized)  Chronic pain of left knee     Problem List Patient Active Problem List   Diagnosis Date Noted   Essential hypertension, benign 11/19/2020   Hypothyroidism (acquired) 11/19/2020   Depression 11/19/2020   Anxiety attack 11/19/2020   Gwendolyn Grant, PT, DPT, ATC 07/18/21 6:28 PM  [place] [facility] [address] [place], [place], [address] Phone: [phone]   Fax:  [phone]  Name: Markala Sitts MRN: [id number] Date of Birth: 18-Aug-1970

## 2021-07-20 ENCOUNTER — Other Ambulatory Visit: Payer: Self-pay | Admitting: Nurse Practitioner

## 2021-07-20 ENCOUNTER — Other Ambulatory Visit (INDEPENDENT_AMBULATORY_CARE_PROVIDER_SITE_OTHER): Payer: Self-pay | Admitting: Nurse Practitioner

## 2021-07-20 ENCOUNTER — Other Ambulatory Visit: Payer: Self-pay | Admitting: Internal Medicine

## 2021-07-20 DIAGNOSIS — E039 Hypothyroidism, unspecified: Secondary | ICD-10-CM

## 2021-07-23 ENCOUNTER — Other Ambulatory Visit: Payer: Self-pay

## 2021-07-23 ENCOUNTER — Ambulatory Visit: Payer: 59

## 2021-07-23 DIAGNOSIS — M542 Cervicalgia: Secondary | ICD-10-CM | POA: Diagnosis not present

## 2021-07-23 DIAGNOSIS — M25562 Pain in left knee: Secondary | ICD-10-CM

## 2021-07-23 DIAGNOSIS — M6281 Muscle weakness (generalized): Secondary | ICD-10-CM

## 2021-07-23 NOTE — Therapy (Signed)
[place], [place], [address] Phone: [phone]   Fax:  [phone]  Physical Therapy Treatment/Re-certification  Patient Details  Name: Alyssa Vincent MRN: [id number] Date of Birth: 1970/07/19 Referring Provider (PT): Gregor Hams, MD for knee; Patricia Nettle, NP for neck   Encounter Date: 07/23/2021   PT End of Session - 07/23/21 1752     Visit Number 18    Number of Visits 26    Date for PT Re-Evaluation 08/24/21    Authorization Type Aetna VL 60    Authorization Time Period FOTO v6, v10    PT Start Time 1750    PT Stop Time 1761    PT Time Calculation (min) 40 min    Activity Tolerance Patient tolerated treatment well    Behavior During Therapy [facility] for tasks assessed/performed             Past Medical History:  Diagnosis Date   Anxiety    Anxiety attack 11/19/2020   Depression 11/19/2020   Essential hypertension, benign 11/19/2020   GERD (gastroesophageal reflux disease)    Hypertension    Hypothyroidism (acquired) 11/19/2020   Thyroid disease    hypothyroidism    Past Surgical History:  Procedure Laterality Date   BREAST SURGERY Bilateral 2006   Augmentation -saline   HERNIA REPAIR     Umbilical   TONSILLECTOMY      There were no vitals filed for this visit.   Subjective Assessment - 07/23/21 1752     Subjective Patient reports she is ok, but a little achy attributing it to the weather. She reports the neck is a lot better reporting occasional soreness and tightness. She reports subjective overall improvement regarding her neck pain rated as 90-95%. She reports her Lt knee pain continues to fluctuate and the feelings of instability continue to come and go. She notices the instability with prolonged walking/standing. She reports continued cracking/popping of th knee that is not painful, but is annoying. She reports subjective overall improvement regarding her knee rated as 80%  reporting that her instability is still an issue and needs continued work on her strength and balance.    Currently in Pain? Yes    Pain Score 4     Pain Location Knee    Pain Orientation Left;Anterior;Medial    Pain Descriptors / Indicators Sore    Pain Type Chronic pain    Pain Onset More than a month ago    Pain Frequency Intermittent    Aggravating Factors  work activity (being on her feet all day)    Pain Relieving Factors exercise                [facility] PT Assessment - 07/24/21 0001       Assessment   Medical Diagnosis M22.2X2 (ICD-10-CM) - Patellofemoral pain syndrome of left knee; Radiculopathy, cervical region (M54.12)    Referring Provider (PT) Gregor Hams, MD for knee; Patricia Nettle, NP for neck    Onset Date/Surgical Date 03/06/21      Observation/Other Assessments   Focus on Therapeutic Outcomes (FOTO)  neck: 79% function knee: 67%      Functional Tests   Functional tests Squat      Squat   Comments limited depth, excessive anterior tibial translation; pain      AROM   Overall AROM Comments Lt knee flexion 124; bilateral shoulder AROM WNL    Cervical Flexion 60    Cervical Extension 60  Cervical - Right Side Bend 30    Cervical - Left Side Bend 40    Cervical - Right Rotation 70    Cervical - Left Rotation 68      Strength   Overall Strength Comments middle trap strength 5/5 bilateral    Right Shoulder Flexion 5/5    Right Shoulder ABduction 5/5    Right Shoulder Internal Rotation 5/5    Right Shoulder External Rotation 5/5    Left Shoulder Flexion 5/5    Left Shoulder ABduction 5/5    Left Shoulder Internal Rotation 5/5    Left Shoulder External Rotation 5/5    Right Hip Flexion 5/5    Right Hip Extension 4-/5    Right Hip ABduction 4-/5    Left Hip Flexion 4/5    Left Hip Extension 4-/5    Left Hip ABduction 4-/5    Right Knee Flexion 5/5    Right Knee Extension 5/5    Left Knee Flexion 5/5    Left Knee Extension 5/5       Palpation   Palpation comment TTP medial joint line      Special Tests   Other special tests (-) McMurray's (-) Apley's (+) Clarke's sign (-) Thessaley (+) Obers (+) thomas for hip flexor               [facility] Adult PT Treatment:                                                DATE: 07/23/21 Therapeutic Exercise: Reviewed HEP discussing frequency of cervical and knee specific exercises.   Therapeutic Activity: Re-assessment to determine overall functional progress, educating patient on progress towards goals and lingering deficits that will continue to be addressed as part of POC in order to improve her overall function.                         PT Short Term Goals - 07/18/21 1814       PT SHORT TERM GOAL #1   Title Pt will report understanding and adherence to her HEP in order to promote independence in the management of her sxs.    Baseline HEP provided at eval.    Time 4    Period Weeks    Status Achieved    Target Date 05/08/21      PT SHORT TERM GOAL #2   Title Pt will be independant with initial HEP for knee to improve pain management.    Baseline issued at eval    Status Achieved    Target Date 06/26/21      PT SHORT TERM GOAL #3   Title Pt will be able to demonstrate recipricol gait pattern on stairs without knee pain to improve function with occupational tasks.    Baseline patient reports ability to complete reciprocal stepping without pain. 07/18/21    Status Achieved    Target Date 06/26/21               PT Long Term Goals - 07/23/21 1802       PT LONG TERM GOAL #1   Title Pt will achieve 62% FOTO score in order to demonstrate improved functional ability as it relates to her neck pain.    Baseline 46%    Time 8    Period  Weeks    Status Achieved    Target Date 06/05/21      PT LONG TERM GOAL #2   Title Pt will achieve BIL cervical rotation of 65 degrees or greater with 0-4/10 pain in order to turn her head when driving without  limitation.    Baseline See flowsheet    Time 8    Period Weeks    Status Achieved    Target Date 06/05/21      PT LONG TERM GOAL #3   Title Pt will achieve WNL BIL shoulder elevation AROM with 0-4/10 pain in order to reach into cabinets without limitation.    Baseline See flowsheet    Time 8    Period Weeks    Status Achieved    Target Date 06/05/21      PT LONG TERM GOAL #4   Title Pt will achieve BIL global parascapular strength of 4+/5 in order to promote long-term healthy cervical/ scapular positioning.    Baseline 3+/5    Time 8    Period Weeks    Status Achieved    Target Date 06/05/21      PT LONG TERM GOAL #5   Title Pt will achieve cervical flexion and extension AROM of 35 degrees or greater in order to get dressed without limitation.    Baseline 25 degrees each    Time 8    Period Weeks    Status Achieved    Target Date 06/05/21      PT LONG TERM GOAL #6   Title Pt will be able to achieve 61% predicted function on FOTO for knee to indicate clinically meaningful improvement of function.    Baseline see flowsheet    Status Achieved    Target Date 07/17/21      PT LONG TERM GOAL #7   Title Pt will be able to achieve 4+/5 strength on all Lt hip and knee MMTs to improve knee stability during standing and walking.    Baseline see flowsheet    Status On-going    Target Date 07/17/21      PT LONG TERM GOAL #8   Title Pt will be able to demonstrate proper squat mechanics without pain to improve transfers and occupational tasks.    Baseline unable    Status On-going    Target Date 07/17/21                   Plan - 07/23/21 1802     Clinical Impression Statement Alyssa Vincent is progressing well since the start of PT in regards to her cervical and Lt knee pain. At this time she reports no functional limitations as it relates to her neck and demonstrates improvements in cervical ROM and periscapular strength as well as scoring well above predicted functional  outcome on FOTO functional survey. She has met all functional goals as they relate to her cervical condition. In relation to her Lt knee pain she is making steady functional progress with improvements noted in knee ROM and strength and tolerance to progression of closed chain activity. Her signs and symptoms remain consistent with patellofemoral pain, however can not fully rule out meniscal pathology given ongoing joint line tendnerness and reports of occasional instability/giving out of the knee. She has lingering hip weakness and aberrant mechanics with closed chain activity requiring continued emphasis on improving at future sessions in order to improve stability about the knee and optimize her function. She will benefit from continued skilled PT 2xweek  for 4 additional weeks to address the above stated lingering deficits.    PT Frequency 2x / week    PT Duration 4 weeks    PT Treatment/Interventions ADLs/Self Care Home Management;Moist Heat;Biofeedback;Cryotherapy;Neuromuscular re-education;Therapeutic exercise;Therapeutic activities;Balance training;Functional mobility training;Stair training;Gait training;Taping;Manual techniques;Dry needling;Passive range of motion;Patient/family education;Electrical Stimulation;Iontophoresis 33m/ml Dexamethasone;Vasopneumatic Device;Traction    PT Next Visit Plan hip strengthening, progress CKC strengthening as tolerated. update HEP for the knee!!    PT Home Exercise Plan [id number]   Consulted and Agree with Plan of Care Patient             Patient will benefit from skilled therapeutic intervention in order to improve the following deficits and impairments:  Decreased range of motion, Impaired UE functional use, Hypermobility, Pain, Hypomobility, Impaired flexibility, Decreased mobility, Decreased strength, Postural dysfunction  Visit Diagnosis: Chronic pain of left knee  Muscle weakness (generalized)  Cervicalgia     Problem List Patient Active  Problem List   Diagnosis Date Noted   Essential hypertension, benign 11/19/2020   Hypothyroidism (acquired) 11/19/2020   Depression 11/19/2020   Anxiety attack 11/19/2020    SEdwin Cap PT 07/24/2021, 9:04 AM  [facility]GMill[place] [address]Phone: [phone]  Fax:  [phone] Name: Alyssa LewisMRN: [id number] of Birth: 4March 30, 1972

## 2021-07-24 MED ORDER — NP THYROID 60 MG PO TABS: 60.0000 mg | ORAL_TABLET | Freq: Every day | ORAL | 0 refills | Status: DC

## 2021-07-25 ENCOUNTER — Ambulatory Visit: Payer: 59

## 2021-07-25 ENCOUNTER — Other Ambulatory Visit: Payer: Self-pay

## 2021-07-25 DIAGNOSIS — M6281 Muscle weakness (generalized): Secondary | ICD-10-CM

## 2021-07-25 DIAGNOSIS — M25562 Pain in left knee: Secondary | ICD-10-CM

## 2021-07-25 DIAGNOSIS — M542 Cervicalgia: Secondary | ICD-10-CM | POA: Diagnosis not present

## 2021-07-25 DIAGNOSIS — G8929 Other chronic pain: Secondary | ICD-10-CM

## 2021-07-25 MED ORDER — ALPRAZOLAM 0.25 MG PO TABS: 0.2500 mg | ORAL_TABLET | Freq: Every day | ORAL | 0 refills | Status: DC | PRN

## 2021-07-25 MED ORDER — FEXOFENADINE HCL 180 MG PO TABS: 180.0000 mg | ORAL_TABLET | Freq: Every day | ORAL | 1 refills | Status: DC

## 2021-07-25 NOTE — Therapy (Signed)
[place], [place], [address] Phone: [phone]   Fax:  [phone]  Physical Therapy Treatment  Patient Details  Name: Alyssa Vincent MRN: [id number] Date of Birth: [birth year] Referring Provider (PT): Gregor Hams, MD for knee; Patricia Nettle, NP for neck   Encounter Date: 07/25/2021   PT End of Session - 07/25/21 1744     Visit Number 19    Number of Visits 26    Date for PT Re-Evaluation 08/24/21    Authorization Type [employer] VL 60    Authorization Time Period FOTO v6, v10    PT Start Time 1745    PT Stop Time 1826    PT Time Calculation (min) 41 min    Activity Tolerance Patient tolerated treatment well    Behavior During Therapy [facility] for tasks assessed/performed             Past Medical History:  Diagnosis Date   Anxiety    Anxiety attack 11/19/2020   Depression 11/19/2020   Essential hypertension, benign 11/19/2020   GERD (gastroesophageal reflux disease)    Hypertension    Hypothyroidism (acquired) 11/19/2020   Thyroid disease    hypothyroidism    Past Surgical History:  Procedure Laterality Date   BREAST SURGERY Bilateral 2006   Augmentation -saline   HERNIA REPAIR     Umbilical   TONSILLECTOMY      There were no vitals filed for this visit.   Subjective Assessment - 07/25/21 1748     Subjective Pt presents to PT with continued improvement in status. She notes that her L knee continues to hurt and this continues to ebb and flow. She has continued HEP compliance with no adverse effect. Pt is ready to begin PT treatment at this time.    Currently in Pain? Yes    Pain Score 4     Pain Location Knee    Pain Orientation Left           [facility] Adult PT Treatment/Exercise:   Therapeutic Exercise: NuStep lvl 7 LE x 4 min while taking subjective Prone quad stretch LLE 60 seconds  Resisted hip flexion on cybex 2 x 10 @ 30 lbs, bilateral  Resisted hip extension on cybex 2 x 10 @  30 lbs, bilateral  Resisted hip abduction on cybex 2 x 10 @ 30 lbs, bilateral  Leg press DL 2x10 @ 50 lbs (RTB around knees) Eccentric leg press LLE 2 x 10 @ 40 lbs  Resisted hamstring curl 2 x 15 @ 35 lbs  Sidelying hip circles x 20 CW/CCW ea Single leg bridge x 10 ea SLR w/ abduction x 10 ea Lateral walk RTB 3x52ft                               PT Short Term Goals - 07/18/21 1814       PT SHORT TERM GOAL #1   Title Pt will report understanding and adherence to her HEP in order to promote independence in the management of her sxs.    Baseline HEP provided at eval.    Time 4    Period Weeks    Status Achieved    Target Date 05/08/21      PT SHORT TERM GOAL #2   Title Pt will be independant with initial HEP for knee to improve pain management.    Baseline issued at eval  Status Achieved    Target Date 06/26/21      PT SHORT TERM GOAL #3   Title Pt will be able to demonstrate recipricol gait pattern on stairs without knee pain to improve function with occupational tasks.    Baseline patient reports ability to complete reciprocal stepping without pain. 07/18/21    Status Achieved    Target Date 06/26/21               PT Long Term Goals - 07/23/21 1802       PT LONG TERM GOAL #1   Title Pt will achieve 62% FOTO score in order to demonstrate improved functional ability as it relates to her neck pain.    Baseline 46%    Time 8    Period Weeks    Status Achieved    Target Date 06/05/21      PT LONG TERM GOAL #2   Title Pt will achieve BIL cervical rotation of 65 degrees or greater with 0-4/10 pain in order to turn her head when driving without limitation.    Baseline See flowsheet    Time 8    Period Weeks    Status Achieved    Target Date 06/05/21      PT LONG TERM GOAL #3   Title Pt will achieve WNL BIL shoulder elevation AROM with 0-4/10 pain in order to reach into cabinets without limitation.    Baseline See flowsheet    Time 8     Period Weeks    Status Achieved    Target Date 06/05/21      PT LONG TERM GOAL #4   Title Pt will achieve BIL global parascapular strength of 4+/5 in order to promote long-term healthy cervical/ scapular positioning.    Baseline 3+/5    Time 8    Period Weeks    Status Achieved    Target Date 06/05/21      PT LONG TERM GOAL #5   Title Pt will achieve cervical flexion and extension AROM of 35 degrees or greater in order to get dressed without limitation.    Baseline 25 degrees each    Time 8    Period Weeks    Status Achieved    Target Date 06/05/21      PT LONG TERM GOAL #6   Title Pt will be able to achieve 61% predicted function on FOTO for knee to indicate clinically meaningful improvement of function.    Baseline see flowsheet    Status Achieved    Target Date 07/17/21      PT LONG TERM GOAL #7   Title Pt will be able to achieve 4+/5 strength on all Lt hip and knee MMTs to improve knee stability during standing and walking.    Baseline see flowsheet    Status On-going    Target Date 07/17/21      PT LONG TERM GOAL #8   Title Pt will be able to demonstrate proper squat mechanics without pain to improve transfers and occupational tasks.    Baseline unable    Status On-going    Target Date 07/17/21                   Plan - 07/25/21 1832     Clinical Impression Statement Pt was able to complete all prescribed exercises with on adverse effect or increase in pain. She continues to demonstrate great improvement in proximal hip strength overall functoinal mobility. Attempted deadlifts today, but  pt had difficulty with hip hinge and sequencing movement without lumbar motion. Pt is progressing very well and will continue to be seen and progressed as tolerated.    PT Treatment/Interventions ADLs/Self Care Home Management;Moist Heat;Biofeedback;Cryotherapy;Neuromuscular re-education;Therapeutic exercise;Therapeutic activities;Balance training;Functional mobility  training;Stair training;Gait training;Taping;Manual techniques;Dry needling;Passive range of motion;Patient/family education;Electrical Stimulation;Iontophoresis 4mg /ml Dexamethasone;Vasopneumatic Device;Traction    PT Next Visit Plan hip strengthening, progress CKC strengthening as tolerated. update HEP for the knee!!    PT Home Exercise Plan [id number]             Patient will benefit from skilled therapeutic intervention in order to improve the following deficits and impairments:  Decreased range of motion, Impaired UE functional use, Hypermobility, Pain, Hypomobility, Impaired flexibility, Decreased mobility, Decreased strength, Postural dysfunction  Visit Diagnosis: Chronic pain of left knee  Muscle weakness (generalized)     Problem List Patient Active Problem List   Diagnosis Date Noted   Essential hypertension, benign 11/19/2020   Hypothyroidism (acquired) 11/19/2020   Depression 11/19/2020   Anxiety attack 11/19/2020    Ward Chatters, PT 07/25/2021, 6:37 PM  [facility] [address] [place], [place], [address] Phone: [phone]   Fax:  [phone]  Name: Cailen Texeira MRN: [id number] Date of Birth: 1970/10/27

## 2021-07-30 ENCOUNTER — Ambulatory Visit: Payer: 59

## 2021-07-30 ENCOUNTER — Other Ambulatory Visit: Payer: Self-pay

## 2021-07-30 DIAGNOSIS — M25562 Pain in left knee: Secondary | ICD-10-CM

## 2021-07-30 DIAGNOSIS — G8929 Other chronic pain: Secondary | ICD-10-CM

## 2021-07-30 DIAGNOSIS — M542 Cervicalgia: Secondary | ICD-10-CM | POA: Diagnosis not present

## 2021-07-30 DIAGNOSIS — M6281 Muscle weakness (generalized): Secondary | ICD-10-CM

## 2021-07-30 NOTE — Therapy (Signed)
[place], [place], [address] Phone: [phone]   Fax:  [phone]  Physical Therapy Treatment  Patient Details  Name: Alyssa Vincent MRN: [id number] Date of Birth: June 26, 1971 Referring Provider (PT): Gregor Hams, MD for knee; Patricia Nettle, NP for neck   Encounter Date: 07/30/2021   PT End of Session - 07/30/21 1750     Visit Number 20    Number of Visits 26    Date for PT Re-Evaluation 08/24/21    Authorization Type [employer] VL 60    Authorization Time Period FOTO v6, v10    PT Start Time 2637    PT Stop Time 8588    PT Time Calculation (min) 41 min    Activity Tolerance Patient tolerated treatment well    Behavior During Therapy [facility] for tasks assessed/performed             Past Medical History:  Diagnosis Date   Anxiety    Anxiety attack 11/19/2020   Depression 11/19/2020   Essential hypertension, benign 11/19/2020   GERD (gastroesophageal reflux disease)    Hypertension    Hypothyroidism (acquired) 11/19/2020   Thyroid disease    hypothyroidism    Past Surgical History:  Procedure Laterality Date   BREAST SURGERY Bilateral 2006   Augmentation -saline   HERNIA REPAIR     Umbilical   TONSILLECTOMY      There were no vitals filed for this visit.   Subjective Assessment - 07/30/21 1752     Subjective Patient reports mild knee pain upon arrival attributing it to the weather.    Currently in Pain? Yes    Pain Score 4     Pain Location Knee    Pain Orientation Left    Pain Descriptors / Indicators Sore    Pain Type Chronic pain    Pain Onset More than a month ago    Pain Frequency Intermittent                   [facility] Adult PT Treatment:                                                DATE: 07/30/21 Therapeutic Exercise: Bike level 2 x 5 minutes  Standing resisted hip abduction 2 x 10 green band Standing resisted hip extension 2 x 10 green band  Standing resisted hip  flexion 2 x 10 green band  Forward static lunge partial range occasional single UE support 2 x 10 bilateral  Hip bridge on stability ball 2 x 10  Quadruped position attempted increased pain Sidelying leg taps forward/backward 2 x 10 bilateral                          PT Short Term Goals - 07/18/21 1814       PT SHORT TERM GOAL #1   Title Pt will report understanding and adherence to her HEP in order to promote independence in the management of her sxs.    Baseline HEP provided at eval.    Time 4    Period Weeks    Status Achieved    Target Date 05/08/21      PT SHORT TERM GOAL #2   Title Pt will be independant with initial HEP for knee to improve pain  management.    Baseline issued at eval    Status Achieved    Target Date 06/26/21      PT SHORT TERM GOAL #3   Title Pt will be able to demonstrate recipricol gait pattern on stairs without knee pain to improve function with occupational tasks.    Baseline patient reports ability to complete reciprocal stepping without pain. 07/18/21    Status Achieved    Target Date 06/26/21               PT Long Term Goals - 07/23/21 1802       PT LONG TERM GOAL #1   Title Pt will achieve 62% FOTO score in order to demonstrate improved functional ability as it relates to her neck pain.    Baseline 46%    Time 8    Period Weeks    Status Achieved    Target Date 06/05/21      PT LONG TERM GOAL #2   Title Pt will achieve BIL cervical rotation of 65 degrees or greater with 0-4/10 pain in order to turn her head when driving without limitation.    Baseline See flowsheet    Time 8    Period Weeks    Status Achieved    Target Date 06/05/21      PT LONG TERM GOAL #3   Title Pt will achieve WNL BIL shoulder elevation AROM with 0-4/10 pain in order to reach into cabinets without limitation.    Baseline See flowsheet    Time 8    Period Weeks    Status Achieved    Target Date 06/05/21      PT LONG TERM GOAL #4    Title Pt will achieve BIL global parascapular strength of 4+/5 in order to promote long-term healthy cervical/ scapular positioning.    Baseline 3+/5    Time 8    Period Weeks    Status Achieved    Target Date 06/05/21      PT LONG TERM GOAL #5   Title Pt will achieve cervical flexion and extension AROM of 35 degrees or greater in order to get dressed without limitation.    Baseline 25 degrees each    Time 8    Period Weeks    Status Achieved    Target Date 06/05/21      PT LONG TERM GOAL #6   Title Pt will be able to achieve 61% predicted function on FOTO for knee to indicate clinically meaningful improvement of function.    Baseline see flowsheet    Status Achieved    Target Date 07/17/21      PT LONG TERM GOAL #7   Title Pt will be able to achieve 4+/5 strength on all Lt hip and knee MMTs to improve knee stability during standing and walking.    Baseline see flowsheet    Status On-going    Target Date 07/17/21      PT LONG TERM GOAL #8   Title Pt will be able to demonstrate proper squat mechanics without pain to improve transfers and occupational tasks.    Baseline unable    Status On-going    Target Date 07/17/21                   Plan - 07/30/21 1755     Clinical Impression Statement Patient tolerated session well today focusing on gluteal strengthening and LE CKC strengthening. Able to introduce forward lunge with patient demonstrating good  eccentric control, though has limited depth at this time due to weakness and balance deficits. Attempted strengthening in quadruped, though this was discontinued due to increased Lt knee pain from direct pressure on the patella.    PT Treatment/Interventions ADLs/Self Care Home Management;Moist Heat;Biofeedback;Cryotherapy;Neuromuscular re-education;Therapeutic exercise;Therapeutic activities;Balance training;Functional mobility training;Stair training;Gait training;Taping;Manual techniques;Dry needling;Passive range of  motion;Patient/family education;Electrical Stimulation;Iontophoresis 4mg /ml Dexamethasone;Vasopneumatic Device;Traction    PT Next Visit Plan hip strengthening, progress CKC strengthening as tolerated (dead lift)    PT Home Exercise Plan [id number]             Patient will benefit from skilled therapeutic intervention in order to improve the following deficits and impairments:  Decreased range of motion, Impaired UE functional use, Hypermobility, Pain, Hypomobility, Impaired flexibility, Decreased mobility, Decreased strength, Postural dysfunction  Visit Diagnosis: Chronic pain of left knee  Muscle weakness (generalized)     Problem List Patient Active Problem List   Diagnosis Date Noted   Essential hypertension, benign 11/19/2020   Hypothyroidism (acquired) 11/19/2020   Depression 11/19/2020   Anxiety attack 11/19/2020   Gwendolyn Grant, PT, DPT, ATC 07/30/21 6:28 PM  [facility] [address] [place], [place], [address] Phone: [phone]   Fax:  [phone]  Name: Alyssa Vincent MRN: [id number] Date of Birth: 1970-10-12

## 2021-07-31 ENCOUNTER — Ambulatory Visit: Payer: 59 | Attending: Family Medicine

## 2021-07-31 DIAGNOSIS — M25562 Pain in left knee: Secondary | ICD-10-CM | POA: Insufficient documentation

## 2021-07-31 DIAGNOSIS — G8929 Other chronic pain: Secondary | ICD-10-CM | POA: Insufficient documentation

## 2021-07-31 DIAGNOSIS — M6281 Muscle weakness (generalized): Secondary | ICD-10-CM | POA: Diagnosis present

## 2021-07-31 DIAGNOSIS — M542 Cervicalgia: Secondary | ICD-10-CM | POA: Diagnosis present

## 2021-07-31 NOTE — Therapy (Signed)
[place], [place], [address] Phone: [phone]   Fax:  [phone]  Physical Therapy Treatment  Patient Details  Name: Alyssa Vincent MRN: [id number] Date of Birth: 1970-09-04 Referring Provider (PT): Gregor Hams, MD for knee; Patricia Nettle, NP for neck   Encounter Date: 07/31/2021   PT End of Session - 07/31/21 1838     Visit Number 21    Number of Visits 26    Date for PT Re-Evaluation 08/24/21    Authorization Type [employer] VL 60    Authorization Time Period FOTO v6, v10    PT Start Time 1835    PT Stop Time 1916   2 min TPDN   PT Time Calculation (min) 41 min    Activity Tolerance Patient tolerated treatment well    Behavior During Therapy [facility] for tasks assessed/performed             Past Medical History:  Diagnosis Date   Anxiety    Anxiety attack 11/19/2020   Depression 11/19/2020   Essential hypertension, benign 11/19/2020   GERD (gastroesophageal reflux disease)    Hypertension    Hypothyroidism (acquired) 11/19/2020   Thyroid disease    hypothyroidism    Past Surgical History:  Procedure Laterality Date   BREAST SURGERY Bilateral 2006   Augmentation -saline   HERNIA REPAIR     Umbilical   TONSILLECTOMY      There were no vitals filed for this visit.   Subjective Assessment - 07/31/21 1838     Subjective Patient reports soreness in the knee right now.    Currently in Pain? Yes    Pain Score 4     Pain Location Knee    Pain Orientation Left;Anterior    Pain Descriptors / Indicators Sore    Pain Type Chronic pain    Pain Onset More than a month ago    Pain Frequency Intermittent    Aggravating Factors  work activity (being on her feet); rain    Pain Relieving Factors rest, heat                    [facility] Adult PT Treatment:                                                DATE: 07/31/21 Therapeutic Exercise: NuStep level 5 x 5 minutes Resisted knee extension 3 x  10 @ 20 lbs Resisted knee flexion 3 x 10 @ 20 lbs BOSU mini squat 1 x 10  Manual Therapy: IASTM to Rt quadriceps   Trigger Point Dry Needling Treatment: Pre-treatment instruction: Patient instructed on dry needling rationale, procedures, and possible side effects including pain during treatment (achy,cramping feeling), bruising, drop of blood, lightheadedness, nausea, sweating. Patient Consent Given: Yes Education handout provided: Previously provided Muscles treated: Rt rectus femoris, vastus intermedius   Treatment response/outcome: Twitch response elicited and Palpable decrease in muscle tension Post-treatment instructions: Patient instructed to expect possible mild to moderate muscle soreness later today and/or tomorrow. Patient instructed in methods to reduce muscle soreness and to continue prescribed HEP. If patient was dry needled over the lung field, patient was instructed on signs and symptoms of pneumothorax and, however unlikely, to see immediate medical attention should they occur. Patient was also educated on signs and symptoms of infection and to seek  medical attention should they occur. Patient verbalized understanding of these instructions and education.                         PT Short Term Goals - 07/18/21 1814       PT SHORT TERM GOAL #1   Title Pt will report understanding and adherence to her HEP in order to promote independence in the management of her sxs.    Baseline HEP provided at eval.    Time 4    Period Weeks    Status Achieved    Target Date 05/08/21      PT SHORT TERM GOAL #2   Title Pt will be independant with initial HEP for knee to improve pain management.    Baseline issued at eval    Status Achieved    Target Date 06/26/21      PT SHORT TERM GOAL #3   Title Pt will be able to demonstrate recipricol gait pattern on stairs without knee pain to improve function with occupational tasks.    Baseline patient reports ability to  complete reciprocal stepping without pain. 07/18/21    Status Achieved    Target Date 06/26/21               PT Long Term Goals - 07/23/21 1802       PT LONG TERM GOAL #1   Title Pt will achieve 62% FOTO score in order to demonstrate improved functional ability as it relates to her neck pain.    Baseline 46%    Time 8    Period Weeks    Status Achieved    Target Date 06/05/21      PT LONG TERM GOAL #2   Title Pt will achieve BIL cervical rotation of 65 degrees or greater with 0-4/10 pain in order to turn her head when driving without limitation.    Baseline See flowsheet    Time 8    Period Weeks    Status Achieved    Target Date 06/05/21      PT LONG TERM GOAL #3   Title Pt will achieve WNL BIL shoulder elevation AROM with 0-4/10 pain in order to reach into cabinets without limitation.    Baseline See flowsheet    Time 8    Period Weeks    Status Achieved    Target Date 06/05/21      PT LONG TERM GOAL #4   Title Pt will achieve BIL global parascapular strength of 4+/5 in order to promote long-term healthy cervical/ scapular positioning.    Baseline 3+/5    Time 8    Period Weeks    Status Achieved    Target Date 06/05/21      PT LONG TERM GOAL #5   Title Pt will achieve cervical flexion and extension AROM of 35 degrees or greater in order to get dressed without limitation.    Baseline 25 degrees each    Time 8    Period Weeks    Status Achieved    Target Date 06/05/21      PT LONG TERM GOAL #6   Title Pt will be able to achieve 61% predicted function on FOTO for knee to indicate clinically meaningful improvement of function.    Baseline see flowsheet    Status Achieved    Target Date 07/17/21      PT LONG TERM GOAL #7   Title Pt will be able to  achieve 4+/5 strength on all Lt hip and knee MMTs to improve knee stability during standing and walking.    Baseline see flowsheet    Status On-going    Target Date 07/17/21      PT LONG TERM GOAL #8   Title  Pt will be able to demonstrate proper squat mechanics without pain to improve transfers and occupational tasks.    Baseline unable    Status On-going    Target Date 07/17/21                   Plan - 08/01/21 0905     Clinical Impression Statement Continued with LE strengthening, focusing more on OKC strenghtening tonight given patient's soreness that is likely lingering from yesterday's PT session that was more focused on CKC strengthening. TPDN performed to distal rectus femoris/vastus intermedius with twitch response elicted. She tolerated all strengthening well without increased pain, though quickly fatigued with targeted quad strengthening. She required initial cues to decrease excessive anterior tibial translation and excessive lordosis with mini squat on BOSU with ability to correct once cued.    PT Treatment/Interventions ADLs/Self Care Home Management;Moist Heat;Biofeedback;Cryotherapy;Neuromuscular re-education;Therapeutic exercise;Therapeutic activities;Balance training;Functional mobility training;Stair training;Gait training;Taping;Manual techniques;Dry needling;Passive range of motion;Patient/family education;Electrical Stimulation;Iontophoresis 4mg /ml Dexamethasone;Vasopneumatic Device;Traction    PT Next Visit Plan hip strengthening, progress CKC strengthening as tolerated (dead lift)    PT Home Exercise Plan [id number]             Patient will benefit from skilled therapeutic intervention in order to improve the following deficits and impairments:  Decreased range of motion, Impaired UE functional use, Hypermobility, Pain, Hypomobility, Impaired flexibility, Decreased mobility, Decreased strength, Postural dysfunction  Visit Diagnosis: Chronic pain of left knee  Muscle weakness (generalized)  Cervicalgia     Problem List Patient Active Problem List   Diagnosis Date Noted   Essential hypertension, benign 11/19/2020   Hypothyroidism (acquired) 11/19/2020    Depression 11/19/2020   Anxiety attack 11/19/2020   Gwendolyn Grant, PT, DPT, ATC 08/01/21 9:11 AM   [facility] [address] [place], [place], [address] Phone: [phone]   Fax:  [phone]  Name: Alyssa Vincent MRN: [id number] Date of Birth: 10-26-1970

## 2021-08-06 ENCOUNTER — Other Ambulatory Visit: Payer: Self-pay

## 2021-08-06 ENCOUNTER — Ambulatory Visit: Payer: 59

## 2021-08-06 DIAGNOSIS — M542 Cervicalgia: Secondary | ICD-10-CM

## 2021-08-06 DIAGNOSIS — M6281 Muscle weakness (generalized): Secondary | ICD-10-CM

## 2021-08-06 DIAGNOSIS — G8929 Other chronic pain: Secondary | ICD-10-CM

## 2021-08-06 DIAGNOSIS — M25562 Pain in left knee: Secondary | ICD-10-CM

## 2021-08-06 NOTE — Therapy (Signed)
Alyssa Vincent, [place], [address] Phone: [phone]   Fax:  [phone]  Physical Therapy Treatment  Patient Details  Name: Alyssa Vincent MRN: [id number] Date of Birth: 25-Jun-1971 Referring Provider (PT): Gregor Hams, MD for knee; Patricia Nettle, NP for neck   Encounter Date: 08/06/2021   PT End of Session - 08/06/21 1822     Visit Number 22    Number of Visits 26    Date for PT Re-Evaluation 08/24/21    Authorization Type [employer] VL [id number]    Authorization Time Period FOTO v6, v10    PT Start Time 1825    PT Stop Time 1910    PT Time Calculation (min) 45 min    Activity Tolerance Patient tolerated treatment well    Behavior During Therapy [facility] for tasks assessed/performed             Past Medical History:  Diagnosis Date   Anxiety    Anxiety attack 11/19/2020   Depression 11/19/2020   Essential hypertension, benign 11/19/2020   GERD (gastroesophageal reflux disease)    Hypertension    Hypothyroidism (acquired) 11/19/2020   Thyroid disease    hypothyroidism    Past Surgical History:  Procedure Laterality Date   BREAST SURGERY Bilateral 2006   Augmentation -saline   HERNIA REPAIR     Umbilical   TONSILLECTOMY      There were no vitals filed for this visit.   Subjective Assessment - 08/06/21 1823     Subjective Pt reports increased Lt knee pain today and presents with a neoprene knee brace. She denies any change in activity, but does rather attributes the increase in pain to the change in weather. She reports varied HEP adherence due to being sick last weekend.    Currently in Pain? Yes    Pain Score 6     Pain Location Knee    Pain Orientation Left    Pain Descriptors / Indicators Sore    Pain Type Chronic pain                   [facility] Adult PT Treatment:                                                DATE: 08/06/2021 Therapeutic Exercise: Traditional dead lift 3x8 with 45#  barbell Standing open book stretch 2x5 BIL Standing hip Cybex abduction with 37.5# 2x10 BIL Standing hip Cybex extension with 37.5# 2x10 BIL 4-inch box heel taps 2x10 BIL Kickstand stance Pallof press with 3# cable 2x10 BIL into both hip ER and IR Standing windmill stretches x10 BIL Manual Therapy: N/A Neuromuscular re-ed: N/A Therapeutic Activity: N/A Modalities: N/A Self Care: N/A                        PT Short Term Goals - 07/18/21 1814       PT SHORT TERM GOAL #1   Title Pt will report understanding and adherence to her HEP in order to promote independence in the management of her sxs.    Baseline HEP provided at eval.    Time 4    Period Weeks    Status Achieved    Target Date 05/08/21      PT SHORT TERM GOAL #2   Title Pt  will be independant with initial HEP for knee to improve pain management.    Baseline issued at eval    Status Achieved    Target Date 06/26/21      PT SHORT TERM GOAL #3   Title Pt will be able to demonstrate recipricol gait pattern on stairs without knee pain to improve function with occupational tasks.    Baseline patient reports ability to complete reciprocal stepping without pain. 07/18/21    Status Achieved    Target Date 06/26/21               PT Long Term Goals - 07/23/21 1802       PT LONG TERM GOAL #1   Title Pt will achieve 62% FOTO score in order to demonstrate improved functional ability as it relates to her neck pain.    Baseline 46%    Time 8    Period Weeks    Status Achieved    Target Date 06/05/21      PT LONG TERM GOAL #2   Title Pt will achieve BIL cervical rotation of 65 degrees or greater with 0-4/10 pain in order to turn her head when driving without limitation.    Baseline See flowsheet    Time 8    Period Weeks    Status Achieved    Target Date 06/05/21      PT LONG TERM GOAL #3   Title Pt will achieve WNL BIL shoulder elevation AROM with 0-4/10 pain in order to reach into  cabinets without limitation.    Baseline See flowsheet    Time 8    Period Weeks    Status Achieved    Target Date 06/05/21      PT LONG TERM GOAL #4   Title Pt will achieve BIL global parascapular strength of 4+/5 in order to promote long-term healthy cervical/ scapular positioning.    Baseline 3+/5    Time 8    Period Weeks    Status Achieved    Target Date 06/05/21      PT LONG TERM GOAL #5   Title Pt will achieve cervical flexion and extension AROM of 35 degrees or greater in order to get dressed without limitation.    Baseline 25 degrees each    Time 8    Period Weeks    Status Achieved    Target Date 06/05/21      PT LONG TERM GOAL #6   Title Pt will be able to achieve 61% predicted function on FOTO for knee to indicate clinically meaningful improvement of function.    Baseline see flowsheet    Status Achieved    Target Date 07/17/21      PT LONG TERM GOAL #7   Title Pt will be able to achieve 4+/5 strength on all Lt hip and knee MMTs to improve knee stability during standing and walking.    Baseline see flowsheet    Status On-going    Target Date 07/17/21      PT LONG TERM GOAL #8   Title Pt will be able to demonstrate proper squat mechanics without pain to improve transfers and occupational tasks.    Baseline unable    Status On-going    Target Date 07/17/21                   Plan - 08/06/21 1835     Clinical Impression Statement Pt responded well to all interventions today, demonstrating excellent form and  no increase in pain with selected exercises. Of note, pt achieved good form with dead lifts today. She reports increased fatigue, but no increase in baseline pain at the end of the session. She will continue to benefit from skilled PT to address her primary impairments and return to her prior level of function with less limitation.    Personal Factors and Comorbidities Comorbidity 3+    Comorbidities hx of anxiety, depression, HTN, hyperlipidemia     Examination-Activity Limitations Bathing;Reach Overhead;Sleep;Lift;Stairs;Squat    Examination-Participation Restrictions Cleaning;Meal Prep;Community Activity;Laundry;Occupation    Stability/Clinical Decision Making Stable/Uncomplicated    Clinical Decision Making Low    Rehab Potential Good    PT Frequency 2x / week    PT Duration 4 weeks    PT Treatment/Interventions ADLs/Self Care Home Management;Moist Heat;Biofeedback;Cryotherapy;Neuromuscular re-education;Therapeutic exercise;Therapeutic activities;Balance training;Functional mobility training;Stair training;Gait training;Taping;Manual techniques;Dry needling;Passive range of motion;Patient/family education;Electrical Stimulation;Iontophoresis 4mg /ml Dexamethasone;Vasopneumatic Device;Traction    PT Next Visit Plan hip strengthening, progress CKC strengthening as tolerated (dead lift)    PT Home Exercise Plan [id number]    [id number] and Agree with Plan of Care Patient             Patient will benefit from skilled therapeutic intervention in order to improve the following deficits and impairments:  Decreased range of motion, Impaired UE functional use, Hypermobility, Pain, Hypomobility, Impaired flexibility, Decreased mobility, Decreased strength, Postural dysfunction  Visit Diagnosis: Chronic pain of left knee  Muscle weakness (generalized)  Cervicalgia     Problem List Patient Active Problem List   Diagnosis Date Noted   Essential hypertension, benign 11/19/2020   Hypothyroidism (acquired) 11/19/2020   Depression 11/19/2020   Anxiety attack 11/19/2020    Cherie Ouch, PT 08/06/2021, 7:09 PM  [place] [facility] [address] [place], [place], [address] Phone: [phone]   Fax:  [phone]  Name: Alyssa Vincent MRN: [id number] Date of Birth: [birth year]

## 2021-08-07 ENCOUNTER — Ambulatory Visit: Payer: 59

## 2021-08-07 DIAGNOSIS — G8929 Other chronic pain: Secondary | ICD-10-CM

## 2021-08-07 DIAGNOSIS — M25562 Pain in left knee: Secondary | ICD-10-CM

## 2021-08-07 DIAGNOSIS — M6281 Muscle weakness (generalized): Secondary | ICD-10-CM

## 2021-08-07 NOTE — Therapy (Signed)
[place], [place], [address] Phone: [phone]   Fax:  [phone]  Physical Therapy Treatment  Patient Details  Name: Alyssa Vincent MRN: [id number] Date of Birth: Aug 28, 1970 Referring Provider (PT): Gregor Hams, MD for knee; Patricia Nettle, NP for neck   Encounter Date: 08/07/2021   PT End of Session - 08/07/21 1744     Visit Number 23    Number of Visits 26    Date for PT Re-Evaluation 08/24/21    Authorization Type [employer]et[employer] VL 60    Authorization Time Period FOTO v6, v10    PT Start Time 1745    PT Stop Time 6301    PT Time Calculation (min) 42 min    Activity Tolerance Patient tolerated treatment well    Behavior During Therapy [facility] for tasks assessed/performed             Past Medical History:  Diagnosis Date   Anxiety    Anxiety attack 11/19/2020   Depression 11/19/2020   Essential hypertension, benign 11/19/2020   GERD (gastroesophageal reflux disease)    Hypertension    Hypothyroidism (acquired) 11/19/2020   Thyroid disease    hypothyroidism    Past Surgical History:  Procedure Laterality Date   BREAST SURGERY Bilateral 2006   Augmentation -saline   HERNIA REPAIR     Umbilical   TONSILLECTOMY      There were no vitals filed for this visit.   Subjective Assessment - 08/07/21 1744     Subjective Pt presents to PT with slight Lt knee pain. She is ready to begin PT treatment at this time.    Currently in Pain? Yes    Pain Score 3     Pain Location Knee    Pain Orientation Left           [facility] Adult PT Treatment:                                                DATE: 08/07/2021 Therapeutic Exercise: Pilates yellow spring leg extension 2x10 each Pilates yellow spring extension with abd return 2x10 each Pilates spring abd with LE extened and return 2x10 each Sidelying hip abd circles x 10 cw/ccw each Single leg bridge 2x10 each  Traditional dead lift 2x10 with 45#  barbell Standing hip Cybex abduction with 37.5# 2x10 BIL Standing hip Cybex extension with 37.5# 2x10 BIL Manual Therapy: N/A Neuromuscular re-ed: N/A Therapeutic Activity: N/A Modalities: N/A Self Care: N/A                               PT Short Term Goals - 07/18/21 1814       PT SHORT TERM GOAL #1   Title Pt will report understanding and adherence to her HEP in order to promote independence in the management of her sxs.    Baseline HEP provided at eval.    Time 4    Period Weeks    Status Achieved    Target Date 05/08/21      PT SHORT TERM GOAL #2   Title Pt will be independant with initial HEP for knee to improve pain management.    Baseline issued at eval    Status Achieved    Target Date 06/26/21  PT SHORT TERM GOAL #3   Title Pt will be able to demonstrate recipricol gait pattern on stairs without knee pain to improve function with occupational tasks.    Baseline patient reports ability to complete reciprocal stepping without pain. 07/18/21    Status Achieved    Target Date 06/26/21               PT Long Term Goals - 07/23/21 1802       PT LONG TERM GOAL #1   Title Pt will achieve 62% FOTO score in order to demonstrate improved functional ability as it relates to her neck pain.    Baseline 46%    Time 8    Period Weeks    Status Achieved    Target Date 06/05/21      PT LONG TERM GOAL #2   Title Pt will achieve BIL cervical rotation of 65 degrees or greater with 0-4/10 pain in order to turn her head when driving without limitation.    Baseline See flowsheet    Time 8    Period Weeks    Status Achieved    Target Date 06/05/21      PT LONG TERM GOAL #3   Title Pt will achieve WNL BIL shoulder elevation AROM with 0-4/10 pain in order to reach into cabinets without limitation.    Baseline See flowsheet    Time 8    Period Weeks    Status Achieved    Target Date 06/05/21      PT LONG TERM GOAL #4   Title Pt will  achieve BIL global parascapular strength of 4+/5 in order to promote long-term healthy cervical/ scapular positioning.    Baseline 3+/5    Time 8    Period Weeks    Status Achieved    Target Date 06/05/21      PT LONG TERM GOAL #5   Title Pt will achieve cervical flexion and extension AROM of 35 degrees or greater in order to get dressed without limitation.    Baseline 25 degrees each    Time 8    Period Weeks    Status Achieved    Target Date 06/05/21      PT LONG TERM GOAL #6   Title Pt will be able to achieve 61% predicted function on FOTO for knee to indicate clinically meaningful improvement of function.    Baseline see flowsheet    Status Achieved    Target Date 07/17/21      PT LONG TERM GOAL #7   Title Pt will be able to achieve 4+/5 strength on all Lt hip and knee MMTs to improve knee stability during standing and walking.    Baseline see flowsheet    Status On-going    Target Date 07/17/21      PT LONG TERM GOAL #8   Title Pt will be able to demonstrate proper squat mechanics without pain to improve transfers and occupational tasks.    Baseline unable    Status On-going    Target Date 07/17/21                   Plan - 08/08/21 0936     Clinical Impression Statement Pt was able to complete all prescribed exercises with no adverse effect or increase in pain. Therapy today focused on improving and continued progression of proximal hip strength, with pt continuing to demonstrate improved form and functional endurance during exercise. PT will continue to progress as  tolerated per POC.    PT Treatment/Interventions ADLs/Self Care Home Management;Moist Heat;Biofeedback;Cryotherapy;Neuromuscular re-education;Therapeutic exercise;Therapeutic activities;Balance training;Functional mobility training;Stair training;Gait training;Taping;Manual techniques;Dry needling;Passive range of motion;Patient/family education;Electrical Stimulation;Iontophoresis 4mg /ml  Dexamethasone;Vasopneumatic Device;Traction    PT Next Visit Plan hip strengthening, progress CKC strengthening as tolerated (dead lift)    PT Home Exercise Plan [id number]             Patient will benefit from skilled therapeutic intervention in order to improve the following deficits and impairments:  Decreased range of motion, Impaired UE functional use, Hypermobility, Pain, Hypomobility, Impaired flexibility, Decreased mobility, Decreased strength, Postural dysfunction  Visit Diagnosis: Chronic pain of left knee  Muscle weakness (generalized)     Problem List Patient Active Problem List   Diagnosis Date Noted   Essential hypertension, benign 11/19/2020   Hypothyroidism (acquired) 11/19/2020   Depression 11/19/2020   Anxiety attack 11/19/2020    Ward Chatters, PT 08/08/2021, 9:37 AM  [facility] [address] [place], [place], [address] Phone: [phone]   Fax:  [phone]  Name: Alyssa Vincent MRN: [id number] Date of Birth: [birth year]

## 2021-08-13 ENCOUNTER — Ambulatory Visit: Payer: 59

## 2021-08-13 ENCOUNTER — Other Ambulatory Visit: Payer: Self-pay

## 2021-08-13 DIAGNOSIS — M25562 Pain in left knee: Secondary | ICD-10-CM | POA: Diagnosis not present

## 2021-08-13 DIAGNOSIS — G8929 Other chronic pain: Secondary | ICD-10-CM

## 2021-08-13 DIAGNOSIS — M6281 Muscle weakness (generalized): Secondary | ICD-10-CM

## 2021-08-13 NOTE — Therapy (Signed)
[place], [place], [address] Phone: [phone]   Fax:  [phone]  Physical Therapy Treatment  Patient Details  Name: Alyssa Vincent MRN: [id number] Date of Birth: 1971-06-10 Referring Provider (PT): Gregor Hams, MD for knee; Patricia Nettle, NP for neck   Encounter Date: 08/13/2021   PT End of Session - 08/13/21 1752     Visit Number 24    Number of Visits 26    Date for PT Re-Evaluation 08/24/21    Authorization Type Aetna VL 60    Authorization Time Period FOTO v6, v10    PT Start Time 1752   patient late   PT Stop Time 1830    PT Time Calculation (min) 38 min    Activity Tolerance Patient tolerated treatment well    Behavior During Therapy [facility] for tasks assessed/performed             Past Medical History:  Diagnosis Date   Anxiety    Anxiety attack 11/19/2020   Depression 11/19/2020   Essential hypertension, benign 11/19/2020   GERD (gastroesophageal reflux disease)    Hypertension    Hypothyroidism (acquired) 11/19/2020   Thyroid disease    hypothyroidism    Past Surgical History:  Procedure Laterality Date   BREAST SURGERY Bilateral 2006   Augmentation -saline   HERNIA REPAIR     Umbilical   TONSILLECTOMY      There were no vitals filed for this visit.   Subjective Assessment - 08/13/21 1756     Subjective Patient reports she can't feel any pain right now because she has been on her feet all day. She continues to report occasional instability about the knee. She has appt with PCP on Friday.    Currently in Pain? No/denies                [facility] PT Assessment - 08/13/21 0001       Strength   Right Hip ABduction 4+/5    Left Hip ABduction 4+/5               [facility] Adult PT Treatment:                                                DATE: 08/13/21 Therapeutic Exercise: Elliptical level 2 x 5 minutes  Lateral and forward band walks blue band at shins 4 x 20  ft Resisted hamstring curl 2 x 10 Forward lunge 2 x 10  Attempted quadruped, unable to assume position due to Lt knee pain                           PT Short Term Goals - 07/18/21 1814       PT SHORT TERM GOAL #1   Title Pt will report understanding and adherence to her HEP in order to promote independence in the management of her sxs.    Baseline HEP provided at eval.    Time 4    Period Weeks    Status Achieved    Target Date 05/08/21      PT SHORT TERM GOAL #2   Title Pt will be independant with initial HEP for knee to improve pain management.    Baseline issued at eval    Status Achieved  Target Date 06/26/21      PT SHORT TERM GOAL #3   Title Pt will be able to demonstrate recipricol gait pattern on stairs without knee pain to improve function with occupational tasks.    Baseline patient reports ability to complete reciprocal stepping without pain. 07/18/21    Status Achieved    Target Date 06/26/21               PT Long Term Goals - 07/23/21 1802       PT LONG TERM GOAL #1   Title Pt will achieve 62% FOTO score in order to demonstrate improved functional ability as it relates to her neck pain.    Baseline 46%    Time 8    Period Weeks    Status Achieved    Target Date 06/05/21      PT LONG TERM GOAL #2   Title Pt will achieve BIL cervical rotation of 65 degrees or greater with 0-4/10 pain in order to turn her head when driving without limitation.    Baseline See flowsheet    Time 8    Period Weeks    Status Achieved    Target Date 06/05/21      PT LONG TERM GOAL #3   Title Pt will achieve WNL BIL shoulder elevation AROM with 0-4/10 pain in order to reach into cabinets without limitation.    Baseline See flowsheet    Time 8    Period Weeks    Status Achieved    Target Date 06/05/21      PT LONG TERM GOAL #4   Title Pt will achieve BIL global parascapular strength of 4+/5 in order to promote long-term healthy cervical/ scapular  positioning.    Baseline 3+/5    Time 8    Period Weeks    Status Achieved    Target Date 06/05/21      PT LONG TERM GOAL #5   Title Pt will achieve cervical flexion and extension AROM of 35 degrees or greater in order to get dressed without limitation.    Baseline 25 degrees each    Time 8    Period Weeks    Status Achieved    Target Date 06/05/21      PT LONG TERM GOAL #6   Title Pt will be able to achieve 61% predicted function on FOTO for knee to indicate clinically meaningful improvement of function.    Baseline see flowsheet    Status Achieved    Target Date 07/17/21      PT LONG TERM GOAL #7   Title Pt will be able to achieve 4+/5 strength on all Lt hip and knee MMTs to improve knee stability during standing and walking.    Baseline see flowsheet    Status On-going    Target Date 07/17/21      PT LONG TERM GOAL #8   Title Pt will be able to demonstrate proper squat mechanics without pain to improve transfers and occupational tasks.    Baseline unable    Status On-going    Target Date 07/17/21                   Plan - 08/13/21 1811     Clinical Impression Statement Continued with CKC strengthening focusing on appropriate form and sequencing of movement. With band walks she has initial difficulty controlling for excessive lumbar lordosis and allowing for sequential hip and knee flexion. With continued practice and heavy cueing  she is able to demonstrate appropriate positioning. Hip abductor strength has significantly improved compared to last assessment. She remains unable to tolerate quadruped position due to pain in the Lt knee.    PT Treatment/Interventions ADLs/Self Care Home Management;Moist Heat;Biofeedback;Cryotherapy;Neuromuscular re-education;Therapeutic exercise;Therapeutic activities;Balance training;Functional mobility training;Stair training;Gait training;Taping;Manual techniques;Dry needling;Passive range of motion;Patient/family education;Electrical  Stimulation;Iontophoresis 4mg /ml Dexamethasone;Vasopneumatic Device;Traction    PT Next Visit Plan hip strengthening, progress CKC strengthening as tolerated    PT Home Exercise Plan [id number]    Consulted and Agree with Plan of Care Patient             Patient will benefit from skilled therapeutic intervention in order to improve the following deficits and impairments:  Decreased range of motion, Impaired UE functional use, Hypermobility, Pain, Hypomobility, Impaired flexibility, Decreased mobility, Decreased strength, Postural dysfunction  Visit Diagnosis: Chronic pain of left knee  Muscle weakness (generalized)     Problem List Patient Active Problem List   Diagnosis Date Noted   Essential hypertension, benign 11/19/2020   Hypothyroidism (acquired) 11/19/2020   Depression 11/19/2020   Anxiety attack 11/19/2020   Gwendolyn Grant, PT, DPT, ATC 08/13/21 7:10 PM   [facility] [address] [place], [place], [address] Phone: [phone]   Fax:  [phone]  Name: Alyssa Vincent MRN: [id number] Date of Birth: August 03, 1970

## 2021-08-14 ENCOUNTER — Ambulatory Visit: Payer: 59

## 2021-08-16 ENCOUNTER — Ambulatory Visit: Payer: 59 | Admitting: Nurse Practitioner

## 2021-08-16 ENCOUNTER — Encounter: Payer: Self-pay | Admitting: Nurse Practitioner

## 2021-08-16 ENCOUNTER — Other Ambulatory Visit: Payer: Self-pay

## 2021-08-16 VITALS — BP 126/80 | HR 81 | Resp 18 | Ht 62.0 in | Wt 200.8 lb

## 2021-08-16 DIAGNOSIS — Z1211 Encounter for screening for malignant neoplasm of colon: Secondary | ICD-10-CM

## 2021-08-16 DIAGNOSIS — Z124 Encounter for screening for malignant neoplasm of cervix: Secondary | ICD-10-CM | POA: Diagnosis not present

## 2021-08-16 DIAGNOSIS — I10 Essential (primary) hypertension: Secondary | ICD-10-CM

## 2021-08-16 DIAGNOSIS — M9420 Chondromalacia, unspecified site: Secondary | ICD-10-CM

## 2021-08-16 DIAGNOSIS — E669 Obesity, unspecified: Secondary | ICD-10-CM

## 2021-08-16 DIAGNOSIS — R053 Chronic cough: Secondary | ICD-10-CM

## 2021-08-16 DIAGNOSIS — M4312 Spondylolisthesis, cervical region: Secondary | ICD-10-CM

## 2021-08-16 DIAGNOSIS — E785 Hyperlipidemia, unspecified: Secondary | ICD-10-CM
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
## 2021-08-20 ENCOUNTER — Ambulatory Visit: Payer: 59

## 2021-08-20 ENCOUNTER — Other Ambulatory Visit: Payer: Self-pay

## 2021-08-20 DIAGNOSIS — M6281 Muscle weakness (generalized): Secondary | ICD-10-CM

## 2021-08-20 DIAGNOSIS — M25562 Pain in left knee: Secondary | ICD-10-CM | POA: Diagnosis not present

## 2021-08-20 DIAGNOSIS — G8929 Other chronic pain: Secondary | ICD-10-CM

## 2021-08-20 NOTE — Therapy (Signed)
[place], [place], [address] Phone: [phone]   Fax:  [phone]  Physical Therapy Treatment  Patient Details  Name: Alyssa Vincent MRN: [id number] Date of Birth: 10-09-1970 Referring Provider (PT): Gregor Hams, MD for knee; Patricia Nettle, NP for neck   Encounter Date: 08/20/2021   PT End of Session - 08/20/21 1748     Visit Number 25    Number of Visits 26    Date for PT Re-Evaluation 08/24/21    Authorization Type [employer] [id number]    Authorization Time Period FOTO v6, v10    PT Start Time 1747    PT Stop Time 1829    PT Time Calculation (min) 42 min    Activity Tolerance Patient tolerated treatment well    Behavior During Therapy [facility] for tasks assessed/performed             Past Medical History:  Diagnosis Date   Anxiety    Anxiety attack 11/19/2020   Depression 11/19/2020   Essential hypertension, benign 11/19/2020   GERD (gastroesophageal reflux disease)    Hypertension    Hypothyroidism (acquired) 11/19/2020   Thyroid disease    hypothyroidism    Past Surgical History:  Procedure Laterality Date   BREAST SURGERY Bilateral 2006   Augmentation -saline   HERNIA REPAIR     Umbilical   TONSILLECTOMY      There were no vitals filed for this visit.   Subjective Assessment - 08/20/21 1750     Subjective Patient reports increased pain in the Lt knee after last session that lasted for a few days. She had f/u with PCP and now has f/u scheduled with sports med on 08/26/21.    Currently in Pain? Yes    Pain Score 3     Pain Location Knee    Pain Orientation Left;Anterior;Medial    Pain Descriptors / Indicators Other (Comment)   uncomfortable   Pain Type Chronic pain    Pain Onset More than a month ago    Pain Frequency Intermittent                 [facility] Adult PT Treatment:                                                DATE: 08/20/21 Therapeutic Exercise: Bike level 2 x 5  minutes  Prone quad stretch 60 seconds bilaterally  Sidelying hip abduction 2 x 10; 5 lbs  SLR 2 x 10; 5 lbs; bilateral  DL bridge with SL kick out 2 x 10 bilateral (able to complete hip flexion on RLE and SLR on LLE) Sidelying kickbacks black band 2 x 10                             PT Short Term Goals - 07/18/21 1814       PT SHORT TERM GOAL #1   Title Pt will report understanding and adherence to her HEP in order to promote independence in the management of her sxs.    Baseline HEP provided at eval.    Time 4    Period Weeks    Status Achieved    Target Date 05/08/21      PT SHORT TERM GOAL #2   Title Pt  will be independant with initial HEP for knee to improve pain management.    Baseline issued at eval    Status Achieved    Target Date 06/26/21      PT SHORT TERM GOAL #3   Title Pt will be able to demonstrate recipricol gait pattern on stairs without knee pain to improve function with occupational tasks.    Baseline patient reports ability to complete reciprocal stepping without pain. 07/18/21    Status Achieved    Target Date 06/26/21               PT Long Term Goals - 07/23/21 1802       PT LONG TERM GOAL #1   Title Pt will achieve 62% FOTO score in order to demonstrate improved functional ability as it relates to her neck pain.    Baseline 46%    Time 8    Period Weeks    Status Achieved    Target Date 06/05/21      PT LONG TERM GOAL #2   Title Pt will achieve BIL cervical rotation of 65 degrees or greater with 0-4/10 pain in order to turn her head when driving without limitation.    Baseline See flowsheet    Time 8    Period Weeks    Status Achieved    Target Date 06/05/21      PT LONG TERM GOAL #3   Title Pt will achieve WNL BIL shoulder elevation AROM with 0-4/10 pain in order to reach into cabinets without limitation.    Baseline See flowsheet    Time 8    Period Weeks    Status Achieved    Target Date 06/05/21      PT  LONG TERM GOAL #4   Title Pt will achieve BIL global parascapular strength of 4+/5 in order to promote long-term healthy cervical/ scapular positioning.    Baseline 3+/5    Time 8    Period Weeks    Status Achieved    Target Date 06/05/21      PT LONG TERM GOAL #5   Title Pt will achieve cervical flexion and extension AROM of 35 degrees or greater in order to get dressed without limitation.    Baseline 25 degrees each    Time 8    Period Weeks    Status Achieved    Target Date 06/05/21      PT LONG TERM GOAL #6   Title Pt will be able to achieve 61% predicted function on FOTO for knee to indicate clinically meaningful improvement of function.    Baseline see flowsheet    Status Achieved    Target Date 07/17/21      PT LONG TERM GOAL #7   Title Pt will be able to achieve 4+/5 strength on all Lt hip and knee MMTs to improve knee stability during standing and walking.    Baseline see flowsheet    Status On-going    Target Date 07/17/21      PT LONG TERM GOAL #8   Title Pt will be able to demonstrate proper squat mechanics without pain to improve transfers and occupational tasks.    Baseline unable    Status On-going    Target Date 07/17/21                   Plan - 08/20/21 1801     Clinical Impression Statement Patient arrives with minimal Lt knee pain, though reports increased pain/instability  following last session that lasted for a few days. Session today focused on hip strengthening to assist in improving proximal stability, which she tolerated well without reports of pain. Occasional quad lag present with resisted SLR requiring cues to correct. She is most challenged with targeted hip extensor strengthening on the LLE.    PT Treatment/Interventions ADLs/Self Care Home Management;Moist Heat;Biofeedback;Cryotherapy;Neuromuscular re-education;Therapeutic exercise;Therapeutic activities;Balance training;Functional mobility training;Stair training;Gait  training;Taping;Manual techniques;Dry needling;Passive range of motion;Patient/family education;Electrical Stimulation;Iontophoresis 4mg /ml Dexamethasone;Vasopneumatic Device;Traction    PT Next Visit Plan hip strengthening, progress CKC strengthening as tolerated    PT Home Exercise Plan [id number]    Consulted and Agree with Plan of Care Patient             Patient will benefit from skilled therapeutic intervention in order to improve the following deficits and impairments:  Decreased range of motion, Impaired UE functional use, Hypermobility, Pain, Hypomobility, Impaired flexibility, Decreased mobility, Decreased strength, Postural dysfunction  Visit Diagnosis: Chronic pain of left knee  Muscle weakness (generalized)     Problem List Patient Active Problem List   Diagnosis Date Noted   Essential hypertension, benign 11/19/2020   Hypothyroidism (acquired) 11/19/2020   Depression 11/19/2020   Anxiety attack 11/19/2020   Gwendolyn Grant, PT, DPT, ATC 08/20/21 6:29 PM   [place] [facility] [address] [place], [place], [address] Phone: [phone]   Fax:  [phone]  Name: Alyssa Vincent MRN: [id number] Date of Birth: May 19, 1971

## 2021-08-22 ENCOUNTER — Ambulatory Visit: Payer: 59

## 2021-08-22 ENCOUNTER — Other Ambulatory Visit: Payer: Self-pay

## 2021-08-22 DIAGNOSIS — M25562 Pain in left knee: Secondary | ICD-10-CM | POA: Diagnosis not present

## 2021-08-22 DIAGNOSIS — M6281 Muscle weakness (generalized): Secondary | ICD-10-CM

## 2021-08-22 DIAGNOSIS — G8929 Other chronic pain: Secondary | ICD-10-CM
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
## 2021-08-22 NOTE — Therapy (Signed)
[place], [place], [address] Phone: [phone]   Fax:  [phone]  Physical Therapy Treatment PHYSICAL THERAPY DISCHARGE SUMMARY  Visits from Start of Care: 26  Current functional level related to goals / functional outcomes: See goals   Remaining deficits: See impression   Education / Equipment: See treatment    Patient agrees to discharge. Patient goals were partially met. Patient is being discharged due to maximized rehab potential.   Patient Details  Name: Alyssa Vincent MRN: [id number] Date of Birth: 11/22/1970 Referring Provider (PT): Gregor Hams, MD for knee; Patricia Nettle, NP for neck   Encounter Date: 08/22/2021   PT End of Session - 08/22/21 1746     Visit Number 26    Number of Visits 26    Date for PT Re-Evaluation 08/24/21    Authorization Type [employer] [id number]    Authorization Time Period FOTO v6, v10    PT Start Time 1745    PT Stop Time 2025    PT Time Calculation (min) 26 min    Activity Tolerance Patient tolerated treatment well    Behavior During Therapy [facility] for tasks assessed/performed             Past Medical History:  Diagnosis Date   Anxiety    Anxiety attack 11/19/2020   Depression 11/19/2020   Essential hypertension, benign 11/19/2020   GERD (gastroesophageal reflux disease)    Hypertension    Hypothyroidism (acquired) 11/19/2020   Thyroid disease    hypothyroidism    Past Surgical History:  Procedure Laterality Date   BREAST SURGERY Bilateral 2006   Augmentation -saline   HERNIA REPAIR     Umbilical   TONSILLECTOMY      There were no vitals filed for this visit.   Subjective Assessment - 08/22/21 1747     Subjective Patient reports the Lt knee is sore today. She reports she has not experienced neck pain recently and feels that this has resolved since the start of care. She is able to complete her HEP if she is feeling tight/sore in the neck without  issues. In regards to the Lt knee she reports subjective overall improvement of 80% stating that she still can't kneel on the Lt knee, occasional instability with walking, and ongoing low level anterior knee pain. She reports improvement in stair negotation, bending the knee, squatting, and tolerance to standing/walking. She has f/u with Dr. Georgina Snell on Monday.    Currently in Pain? Yes    Pain Score 3     Pain Location Knee    Pain Orientation Left;Anterior;Medial    Pain Descriptors / Indicators Other (Comment);Tightness;Tender   annoying   Pain Type Chronic pain    Pain Onset More than a month ago    Pain Frequency Intermittent    Aggravating Factors  work activity (being on her feet all day)    Pain Relieving Factors rest                [facility] PT Assessment - 08/22/21 0001       Squat   Comments limited depth, increased knee pain      AROM   Overall AROM Comments Lt knee flexion 124      Strength   Right Hip Flexion 5/5    Right Hip Extension 5/5    Right Hip ABduction 5/5    Left Hip Flexion 5/5    Left Hip Extension 5/5  Left Hip ABduction 5/5    Right Knee Flexion 5/5    Right Knee Extension 5/5    Left Knee Flexion 5/5    Left Knee Extension 5/5      Special Tests   Other special tests (+) Thessaley (+ pain) McMurrays (+) Clarke's                [facility] Adult PT Treatment:                                                DATE: 08/22/21 Therapeutic Exercise: Reviewed HEP discussing frequency of completion.   Therapeutic Activity: Re-assessment to determine overall progress, educating patient on progress towards goals. D/C education.                         PT Short Term Goals - 07/18/21 1814       PT SHORT TERM GOAL #1   Title Pt will report understanding and adherence to her HEP in order to promote independence in the management of her sxs.    Baseline HEP provided at eval.    Time 4    Period Weeks    Status Achieved    Target  Date 05/08/21      PT SHORT TERM GOAL #2   Title Pt will be independant with initial HEP for knee to improve pain management.    Baseline issued at eval    Status Achieved    Target Date 06/26/21      PT SHORT TERM GOAL #3   Title Pt will be able to demonstrate recipricol gait pattern on stairs without knee pain to improve function with occupational tasks.    Baseline patient reports ability to complete reciprocal stepping without pain. 07/18/21    Status Achieved    Target Date 06/26/21               PT Long Term Goals - 08/22/21 1755       PT LONG TERM GOAL #1   Title Pt will achieve 62% FOTO score in order to demonstrate improved functional ability as it relates to her neck pain.    Baseline 46%    Time 8    Period Weeks    Status Achieved    Target Date 06/05/21      PT LONG TERM GOAL #2   Title Pt will achieve BIL cervical rotation of 65 degrees or greater with 0-4/10 pain in order to turn her head when driving without limitation.    Baseline See flowsheet    Time 8    Period Weeks    Status Achieved    Target Date 06/05/21      PT LONG TERM GOAL #3   Title Pt will achieve WNL BIL shoulder elevation AROM with 0-4/10 pain in order to reach into cabinets without limitation.    Baseline See flowsheet    Time 8    Period Weeks    Status Achieved    Target Date 06/05/21      PT LONG TERM GOAL #4   Title Pt will achieve BIL global parascapular strength of 4+/5 in order to promote long-term healthy cervical/ scapular positioning.    Baseline 3+/5    Time 8    Period Weeks    Status Achieved    Target  Date 06/05/21      PT LONG TERM GOAL #5   Title Pt will achieve cervical flexion and extension AROM of 35 degrees or greater in order to get dressed without limitation.    Baseline 25 degrees each    Time 8    Period Weeks    Status Achieved    Target Date 06/05/21      PT LONG TERM GOAL #6   Title Pt will be able to achieve 61% predicted function on FOTO for  knee to indicate clinically meaningful improvement of function.    Baseline see flowsheet    Status Achieved    Target Date 07/17/21      PT LONG TERM GOAL #7   Title Pt will be able to achieve 4+/5 strength on all Lt hip and knee MMTs to improve knee stability during standing and walking.    Baseline see flowsheet    Status Achieved    Target Date 07/17/21      PT LONG TERM GOAL #8   Title Pt will be able to demonstrate proper squat mechanics without pain to improve transfers and occupational tasks.    Baseline unable    Status On-going    Target Date 07/17/21                   Plan - 08/22/21 1805     Clinical Impression Statement Shyenne has progressed well since the start of care reporting full resolution of her neck pain. In regards to her Lt knee she demonstrates significant improvements in ROM and BLE strength. She has tolerated gradual progression into CKC strengthening well, though continues to have limitations into squatting activity secondary to knee pain. Upon re-assessment she has continued joint line tenderness, positive meniscal special testing, and subjectively reports occasional instability about the knee. It is recommended at this time that she f/u with sports med for further evaluation given her ongoing complaints of pain and instability with patient in agreement with this plan. She is therefore appropriate for D/C at this time.    PT Treatment/Interventions ADLs/Self Care Home Management;Moist Heat;Biofeedback;Cryotherapy;Neuromuscular re-education;Therapeutic exercise;Therapeutic activities;Balance training;Functional mobility training;Stair training;Gait training;Taping;Manual techniques;Dry needling;Passive range of motion;Patient/family education;Electrical Stimulation;Iontophoresis 21m/ml Dexamethasone;Vasopneumatic Device;Traction    PT Next Visit Plan n/a d/c    PT Home Exercise Plan [id number]    AJOINOMVEand Agree with Plan of Care Patient              Patient will benefit from skilled therapeutic intervention in order to improve the following deficits and impairments:  Decreased range of motion, Impaired UE functional use, Hypermobility, Pain, Hypomobility, Impaired flexibility, Decreased mobility, Decreased strength, Postural dysfunction  Visit Diagnosis: Chronic pain of left knee  Muscle weakness (generalized)     Problem List Patient Active Problem List   Diagnosis Date Noted   Essential hypertension, benign 11/19/2020   Hypothyroidism (acquired) 11/19/2020   Depression 11/19/2020   Anxiety attack 11/19/2020   SGwendolyn Grant PT, DPT, ATC 08/22/21 6:15 PM  [facility] [address]Phone: [phone]  Fax:  [phone] Name: LLewanna PetrakMRN: [id number] of Birth: [birth year]

## 2021-08-26 ENCOUNTER — Ambulatory Visit: Payer: 59 | Admitting: Family Medicine

## 2021-08-26 ENCOUNTER — Other Ambulatory Visit: Payer: Self-pay

## 2021-08-26 ENCOUNTER — Encounter: Payer: Self-pay | Admitting: Family Medicine

## 2021-08-26 VITALS — BP 110/72 | HR 84 | Ht 62.0 in | Wt 199.6 lb

## 2021-08-26 DIAGNOSIS — M25362 Other instability, left knee: Secondary | ICD-10-CM | POA: Diagnosis not present

## 2021-08-26 DIAGNOSIS — G8929 Other chronic pain: Secondary | ICD-10-CM | POA: Diagnosis not present

## 2021-08-26 DIAGNOSIS — M25562 Pain in left knee: Secondary | ICD-10-CM | POA: Diagnosis not present

## 2021-08-26 NOTE — Patient Instructions (Signed)
Good to see you today.  Proceed w/ your previously-ordered knee MRI.  Follow-up after MRI to review results.

## 2021-09-13 ENCOUNTER — Other Ambulatory Visit: Payer: Self-pay | Admitting: Nurse Practitioner

## 2021-09-13 DIAGNOSIS — I10 Essential (primary) hypertension: Secondary | ICD-10-CM

## 2021-09-20 ENCOUNTER — Other Ambulatory Visit: Payer: Self-pay

## 2021-09-20 DIAGNOSIS — I10 Essential (primary) hypertension: Secondary | ICD-10-CM

## 2021-09-20 DIAGNOSIS — E039 Hypothyroidism, unspecified: Secondary | ICD-10-CM

## 2021-09-20 MED ORDER — NP THYROID 60 MG PO TABS: 60.0000 mg | ORAL_TABLET | Freq: Every day | ORAL | 0 refills | Status: DC

## 2021-09-20 MED ORDER — LOSARTAN POTASSIUM 50 MG PO TABS: 50.0000 mg | ORAL_TABLET | Freq: Every day | ORAL | 1 refills | Status: DC

## 2021-09-20 MED ORDER — ALPRAZOLAM 0.25 MG PO TABS: 0.2500 mg | ORAL_TABLET | Freq: Every day | ORAL | 0 refills | Status: DC | PRN

## 2021-09-20 NOTE — Telephone Encounter (Signed)
Pt is requesting refills on: ?NP THYROID 60 MG tablet ? ?Pharmacy: ?[place], [place]. ? ?ALPRAZolam (XANAX) 0.25 MG tablet ?losartan (COZAAR) 50 MG tablet ? ?[place]arm[id number] [place] ? ?LOV  08/16/21 ? ?

## 2021-09-20 NOTE — Telephone Encounter (Signed)
Patient is requesting a refill of the following medications: ?Requested Prescriptions  ? ?Pending Prescriptions Disp Refills  ? ALPRAZolam (XANAX) 0.25 MG tablet 30 tablet 0  ?  Sig: Take 1 tablet (0.25 mg total) by mouth daily as needed for anxiety.  ? ?Signed Prescriptions Disp Refills  ? losartan (COZAAR) 50 MG tablet 90 tablet 1  ?  Sig: Take 1 tablet (50 mg total) by mouth daily.  ?  Authorizing Provider: [name] E  ?  Ordering User: Rae Mar  ? NP THYROID 60 MG tablet 90 tablet 0  ?  Sig: Take 1 tablet (60 mg total) by mouth daily before breakfast.  ?  Authorizing Provider: [name] E  ?  Ordering User: Rae Mar  ? ? ?Date of patient request: 09/20/2021 ?Last office visit: 08/16/2021 ?Date of last refill: 07/25/2021 ?Last refill amount: 30 tabs ?Follow up time period per chart: no f/u appt on file   ?

## 2021-10-15 ENCOUNTER — Other Ambulatory Visit (HOSPITAL_COMMUNITY)
Admission: RE | Admit: 2021-10-15 | Discharge: 2021-10-15 | Disposition: A | Discharge status: Home / Self Care | Payer: 59 | Source: Ambulatory Visit

## 2021-10-15 ENCOUNTER — Ambulatory Visit (INDEPENDENT_AMBULATORY_CARE_PROVIDER_SITE_OTHER): Payer: 59

## 2021-10-15 VITALS — BP 136/94 | HR 80 | Ht 62.0 in | Wt 200.0 lb

## 2021-10-15 DIAGNOSIS — Z975 Presence of (intrauterine) contraceptive device: Secondary | ICD-10-CM

## 2021-10-15 DIAGNOSIS — Z01419 Encounter for gynecological examination (general) (routine) without abnormal findings: Secondary | ICD-10-CM

## 2021-10-15 DIAGNOSIS — Z1239 Encounter for other screening for malignant neoplasm of breast: Secondary | ICD-10-CM

## 2021-10-15 DIAGNOSIS — Z113 Encounter for screening for infections with a predominantly sexual mode of transmission: Secondary | ICD-10-CM

## 2021-10-15 DIAGNOSIS — Z124 Encounter for screening for malignant neoplasm of cervix: Secondary | ICD-10-CM | POA: Insufficient documentation

## 2021-10-15 NOTE — Progress Notes (Signed)
? ? ?GYNECOLOGY OFFICE VISIT NOTE-WELL WOMAN EXAM ? ?History:  ? Alyssa Vincent [id number] here today for well woman exam. She states she "just wants to make sure everything is okay."  She reports she has had multiple mammograms d/t some discovery of lumps that were found to be normal with follow up exams. She reports occasional periods and has a Mirena in place.  She reports having hot flashes since 51 years old and "they are still going strong."  She reports Mirena was updated prior to moving to [place] in 2021. She denies any abnormal vaginal discharge, bleeding, or pelvic pain.  ? ?Birth Control:  Mirena ? ?Reproductive Concerns ?Sexually Active: No ?Partners Type: Female ?Number of partners in last year: None, but One about 1.5 years ?STD Testing: Full ? ?Vaginal/GU Concerns: No issues with urination, but states "it is definitely something going on with my gut." She denies bouts of diarrhea or constipation.  ?Breast Concerns/Exams: No concerns and does breast exams, but finds them stressful.  She denies known family history of breast, uterine, cervical, or ovarian cancer. ? ?Medical and Nutrition ?PCP: Earl Lites at [place]. Next appt May 5th ?Significant PMx: Feels her cholesterol is high. HTN on meds. Reports taking at night. Hypothyroidism on meds. ?Exercise: Occasionally ?Tobacco/Drugs/Alcohol: Occasional Alcohol usage; Barcardi or "[place]" No tobacco or drug usage.  ?Nutrition: "I am liking vegetables more than I used to." And I eat a lot of chicken. Occasional steak and eggs.  ? ?Social ?Safety at home: Endorses "It's just me" ?DV/A: N/A ?Social Support: Endorses ?Employment: [employer], Phlebotomist ? ?Past Medical History:  ?Diagnosis Date  ? Anemia   ? Anxiety   ? Anxiety attack 11/19/2020  ? Arthritis   ? Asthma   ? Depression 11/19/2020  ? Essential hypertension, benign 11/19/2020  ? GERD (gastroesophageal reflux disease)   ? Hypertension   ? Hypothyroidism (acquired) 11/19/2020  ? Thyroid disease   ?  hypothyroidism  ? ? ?Past Surgical History:  ?Procedure Laterality Date  ? BREAST SURGERY Bilateral 2006  ? Augmentation -saline  ? HERNIA REPAIR    ? Umbilical  ? TONSILLECTOMY    ? ? ?The following portions of the patient's history were reviewed and updated as appropriate: allergies, current medications, past family history, past medical history, past social history, past surgical history and problem list.  ? ?Health Maintenance:  Normal pap and negative HRHPV in 2021 per patient.  Normal mammogram in Jan 2022 per patient.  ? ?Review of Systems:  ?Pertinent items noted in HPI and remainder of comprehensive ROS otherwise negative.   ? ?Objective:  ?  ?Physical Exam ?BP (!) 136/94   Pulse 80   Ht '5\' 2"'$  (1.575 m)   Wt 200 lb (90.7 kg)   BMI 36.58 kg/m?  ?Physical Exam ?Vitals reviewed. Exam conducted with a chaperone present.  ?Constitutional:   ?   Appearance: Normal appearance.  ?HENT:  ?   Head: Normocephalic and atraumatic.  ?Eyes:  ?   Conjunctiva/sclera: Conjunctivae normal.  ?Neck:  ?   Thyroid: No thyroid tenderness.  ?   Trachea: Trachea normal.  ?Cardiovascular:  ?   Rate and Rhythm: Normal rate and regular rhythm.  ?   Heart sounds: Normal heart sounds.  ?Pulmonary:  ?   Effort: Pulmonary effort is normal. No respiratory distress.  ?   Breath sounds: Normal breath sounds.  ?Chest:  ?Breasts: ?   Breasts are symmetrical.  ?   Right: Normal. No bleeding, inverted nipple,  mass, nipple discharge or tenderness.  ?   Left: Normal. No bleeding, inverted nipple, mass, nipple discharge or tenderness.  ?   Comments: CBE completed and normal.  ?Abdominal:  ?   General: Bowel sounds are normal.  ?   Palpations: Abdomen is soft.  ?   Tenderness: There is no abdominal tenderness.  ?Genitourinary: ?   Vagina: Vaginal discharge present. No bleeding (Scant amt thin white).  ?   Cervix: Friability present. No cervical motion tenderness, lesion, erythema or cervical bleeding.  ?   Uterus: Not enlarged and not tender.   ?    Comments: CV collected ?Pap collected with brush and spatula. Cervix with some friability after collection ?Dark IUD strings noted. ~3cm from os. ?No device palpated at external os.  ?Musculoskeletal:  ?   Cervical back: Normal range of motion.  ?Skin: ?   General: Skin is warm and dry.  ?Neurological:  ?   Mental Status: She is alert and oriented to person, place, and time.  ?Psychiatric:     ?   Mood and Affect: Mood normal.     ?   Behavior: Behavior normal.     ?   Thought Content: Thought content normal.  ?  ? ?Labs and Imaging ?No results found for this or any previous visit (from the past 168 hour(s)). ?No results found. ?  ?Assessment & Plan:  ?51 year old Female ?Well Woman Exam with Pap Smear ?Mirena IUD in Place ?Desires STD Screening ?Breast Exam ? ? 1. Encounter for well woman exam with routine gynecological exam ?-Exam performed and findings discussed. ?-Encouraged to activate and utilize Mychart for reviewing of results, communication with office, and scheduling of appts. ?-Educated on AHA exercise recommendations of 30 minutes of moderate to vigorous activity at least 5x/week. ?-Patient to consult with PCP regarding colonoscopy.  ? ?2. Screening breast examination ?-Educated and encouraged to continue monthly SBE with increased breast awareness including examination of breast for skin changes, moles, tenderness, etc.  ?-Encouraged to contact office if concerns regarding breast exams such as lump/mass suspicion, changes, or nipple discharge.  ?-Will place referral for mammogram.  ? ?3. Pap smear for cervical cancer screening ?-Educated on ASCCP guidelines regarding pap smear evaluation and frequency. ?-Informed of turnover time and provider/clinic policy on releasing results. ? ?4. Screening examination for STD (sexually transmitted disease) ?-Patient requests STD screening. ?-GC/CT and Trich collected via self swab. ?-Patient requests blood test be completed at her employer. ?-Requisitions printed  and sent with patient. ?-Will treat appropriately.  ? ?5. IUD (intrauterine device) in place ?-IUD strings noted. ?-Plan for removal in 2028 ? ? ?Routine preventative health maintenance measures emphasized. ?Please refer to After Visit Summary for other counseling recommendations.  ? ?No follow-ups on file. ?  ?  ? ?Maryann Conners, CNM ?10/15/2021  ? ? ? ? ?

## 2021-10-15 NOTE — Progress Notes (Signed)
[facility] presents for AEX/PAP/STD screening. ?

## 2021-10-16 LAB — CERVICOVAGINAL ANCILLARY ONLY
Chlamydia: NEGATIVE
Comment: NEGATIVE
Comment: NEGATIVE
Comment: NORMAL
Neisseria Gonorrhea: NEGATIVE
Trichomonas: NEGATIVE

## 2021-10-16 LAB — CYTOLOGY - PAP
Comment: NEGATIVE
Diagnosis: NEGATIVE
High risk HPV: NEGATIVE

## 2021-10-28 ENCOUNTER — Ambulatory Visit: Payer: 59

## 2021-10-28 ENCOUNTER — Other Ambulatory Visit: Payer: Self-pay | Admitting: Internal Medicine

## 2021-11-01 ENCOUNTER — Encounter: Payer: Self-pay | Admitting: Nurse Practitioner

## 2021-11-01 ENCOUNTER — Ambulatory Visit (INDEPENDENT_AMBULATORY_CARE_PROVIDER_SITE_OTHER): Payer: 59 | Admitting: Nurse Practitioner

## 2021-11-01 VITALS — BP 120/80 | HR 75 | Temp 97.8°F | Ht 62.0 in | Wt 192.0 lb

## 2021-11-01 DIAGNOSIS — I10 Essential (primary) hypertension: Secondary | ICD-10-CM | POA: Diagnosis not present

## 2021-11-01 DIAGNOSIS — Z0001 Encounter for general adult medical examination with abnormal findings: Secondary | ICD-10-CM

## 2021-11-01 DIAGNOSIS — L989 Disorder of the skin and subcutaneous tissue, unspecified: Secondary | ICD-10-CM | POA: Diagnosis not present

## 2021-11-01 DIAGNOSIS — F32A Depression, unspecified: Secondary | ICD-10-CM | POA: Diagnosis not present

## 2021-11-01 MED ORDER — SERTRALINE HCL 50 MG PO TABS: 50.0000 mg | ORAL_TABLET | Freq: Every day | ORAL | 1 refills | Status: DC

## 2021-11-01 NOTE — Assessment & Plan Note (Addendum)
Chronic, stable.  Zoloft 50 mg tablet, she takes 1 tab by mouth daily.  Medication refilled today. ?

## 2021-11-01 NOTE — Patient Instructions (Signed)
[facility]: [phone] option 1 - call them to schedule  ?

## 2021-11-01 NOTE — Assessment & Plan Note (Signed)
Chronic, stable, well controlled.  She will continue taking her losartan 50 mg by mouth daily. ?

## 2021-11-01 NOTE — Assessment & Plan Note (Signed)
Overall exam is benign, she was given phone number to [facility] gastroenterology so she can call and get appointment scheduled to discuss colonoscopy.  She was also encouraged to follow-up with the breast imaging center to make sure she has her mammogram completed. ?

## 2021-11-01 NOTE — Progress Notes (Signed)
? ? ? ?Subjective:  ?Patient ID: Alyssa Vincent, female    DOB: [birth year]  Age: 51 y.o. MRN: [id number] ? ?CC:  ?Chief Complaint  ?Patient presents with  ? Annual Exam  ?  ? ? ?HPI  ?This patient arrives today for the above. ? ?She is up-to-date on Pap smear.  She is due for colon cancer screening via colonoscopy as well as mammogram.  She is waiting on her previous mammogram results to be transferred to the imaging center before having mammogram completed.  Additionally, she was referred to GI at her last visit but has not yet made an appointment to discuss colon cancer screening. ? ?She continues on medication for treatment of depression and hypertension.  She is requesting refill on Zoloft.  She is tolerating her medications well.  She has 3 new skin lesions on her lower extremities that she would like to be evaluated. ? ?Blood work was ordered at last office visit for her to get prior to his upcoming appointment in preparation for annual exam.  She reports she had her blood work done however I do not have any results in the record. ? ?Past Medical History:  ?Diagnosis Date  ? Anemia   ? Anxiety   ? Anxiety attack 11/19/2020  ? Arthritis   ? Asthma   ? Depression 11/19/2020  ? Essential hypertension, benign 11/19/2020  ? GERD (gastroesophageal reflux disease)   ? Hypertension   ? Hypothyroidism (acquired) 11/19/2020  ? Thyroid disease   ? hypothyroidism  ? ? ? ? ?Family History  ?Problem Relation Age of Onset  ? GER disease Mother   ? Diabetes Father   ? Cancer Father   ? Lung cancer Father   ? Anxiety disorder Father   ? ? ?Social History  ? ?Social History Narrative  ? 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  ? ?Social History  ? ?Tobacco Use  ? Smoking status: Never  ? Smokeless tobacco: Never  ?Substance Use Topics  ? Alcohol use: Yes  ?  Comment: occ  ? ? ? ?Current Meds  ?Medication Sig  ? ALPRAZolam (XANAX) 0.25 MG tablet Take 1 tablet (0.25 mg total) by  mouth daily as needed for anxiety.  ? cyclobenzaprine (FLEXERIL) 10 MG tablet Take 1 tablet (10 mg total) by mouth as needed for muscle spasms.  ? famotidine (PEPCID) 20 MG tablet Take 20 mg by mouth 2 (two) times daily.  ? fexofenadine (ALLEGRA) 180 MG tablet Take 1 tablet (180 mg total) by mouth daily.  ? levonorgestrel (MIRENA) 20 MCG/DAY IUD 1 each by Intrauterine route once.  ? losartan (COZAAR) 50 MG tablet Take 1 tablet (50 mg total) by mouth daily.  ? montelukast (SINGULAIR) 10 MG tablet TAKE 1 TABLET BY MOUTH EVERYDAY AT BEDTIME  ? NP THYROID 60 MG tablet Take 1 tablet (60 mg total) by mouth daily before breakfast.  ? [DISCONTINUED] sertraline (ZOLOFT) 50 MG tablet Take 1 tablet (50 mg total) by mouth daily.  ? ? ?ROS:  ?Review of Systems  ?Constitutional:  Positive for malaise/fatigue. Negative for fever and weight loss.  ?Respiratory:  Positive for cough. Negative for shortness of breath and wheezing.   ?Cardiovascular:  Negative for chest pain.  ?Gastrointestinal:  Negative for abdominal pain and blood in stool.  ?Psychiatric/Behavioral:  Positive for depression.   ? ? ?Objective:  ? ?Today's Vitals: BP 120/80 (BP Location: Left Arm, Patient Position: Sitting, Cuff Size: Normal)   Pulse 75  Temp 97.8 ?F (36.6 ?C) (Oral)   Ht '5\' 2"'$  (1.575 m)   Wt 192 lb (87.1 kg)   SpO2 97%   BMI 35.12 kg/m?  ? ?  11/01/2021  ?  2:40 PM 10/15/2021  ?  9:20 AM 08/26/2021  ?  8:01 AM  ?Vitals with BMI  ?Height '5\' 2"'$  '5\' 2"'$  '5\' 2"'$   ?Weight 192 lbs 200 lbs 199 lbs 10 oz  ?BMI 35.11 36.57 36.5  ?Systolic 027 253 664  ?Diastolic 80 94 72  ?Pulse 75 80 84  ?  ? ? ?  10/15/2021  ?  9:33 AM 12/20/2020  ? 11:29 AM  ?PHQ9 SCORE ONLY  ?PHQ-9 Total Score 4 0  ? ? ? ?Physical Exam ?Vitals reviewed.  ?Constitutional:   ?   Appearance: Normal appearance.  ?HENT:  ?   Head: Normocephalic and atraumatic.  ?   Right Ear: Tympanic membrane, ear canal and external ear normal.  ?   Left Ear: Tympanic membrane, ear canal and external ear normal.   ?Eyes:  ?   General:     ?   Right eye: No discharge.     ?   Left eye: No discharge.  ?   Extraocular Movements: Extraocular movements intact.  ?   Conjunctiva/sclera: Conjunctivae normal.  ?   Pupils: Pupils are equal, round, and reactive to light.  ?Neck:  ?   Vascular: No carotid bruit.  ?Cardiovascular:  ?   Rate and Rhythm: Normal rate and regular rhythm.  ?   Pulses: Normal pulses.  ?   Heart sounds: Normal heart sounds. No murmur heard. ?Pulmonary:  ?   Effort: Pulmonary effort is normal.  ?   Breath sounds: Normal breath sounds.  ?Chest:  ?Breasts: ?   Breasts are symmetrical.  ?   Right: Normal.  ?   Left: Normal.  ?   Comments: Liz Beach as chaperone ?Abdominal:  ?   General: Abdomen is flat. Bowel sounds are normal. There is no distension.  ?   Palpations: Abdomen is soft. There is no mass.  ?   Tenderness: There is no abdominal tenderness.  ?Musculoskeletal:     ?   General: No tenderness.  ?   Cervical back: Neck supple. No muscular tenderness.  ?   Right lower leg: No edema.  ?   Left lower leg: No edema.  ?Lymphadenopathy:  ?   Cervical: No cervical adenopathy.  ?   Upper Body:  ?   Right upper body: No supraclavicular adenopathy.  ?   Left upper body: No supraclavicular adenopathy.  ?Skin: ?   General: Skin is warm and dry.  ? ?    ?Neurological:  ?   General: No focal deficit present.  ?   Mental Status: She is alert and oriented to person, place, and time.  ?   Motor: No weakness.  ?   Gait: Gait normal.  ?Psychiatric:     ?   Mood and Affect: Mood normal.     ?   Behavior: Behavior normal.     ?   Judgment: Judgment normal.  ? ? ? ? ? ? ? ?Assessment and Plan  ? ?1. Encounter for general adult medical examination with abnormal findings   ?2. Skin lesion   ?3. Depression, unspecified depression type   ?4. Essential hypertension, benign   ? ? ? ?Plan: ?See plan via problem list below. ? ? ?Tests ordered ?Orders Placed This Encounter  ?Procedures  ?  Ambulatory referral to Dermatology   ? ? ? ? ?Meds ordered this encounter  ?Medications  ? sertraline (ZOLOFT) 50 MG tablet  ?  Sig: Take 1 tablet (50 mg total) by mouth daily.  ?  Dispense:  90 tablet  ?  Refill:  1  ? ? ?Patient to follow-up in 6 months or sooner as needed ? ?Ailene Ards, NP ? ?

## 2021-11-01 NOTE — Assessment & Plan Note (Signed)
3 new lesions, appear fairly benign on exam.  However will refer to dermatology for further assistance with evaluation and management. ?

## 2021-11-15 ENCOUNTER — Encounter: Payer: Self-pay | Admitting: Nurse Practitioner

## 2021-11-18 ENCOUNTER — Other Ambulatory Visit: Payer: Self-pay | Admitting: Nurse Practitioner

## 2021-11-18 DIAGNOSIS — E669 Obesity, unspecified: Secondary | ICD-10-CM

## 2021-11-18 DIAGNOSIS — E039 Hypothyroidism, unspecified: Secondary | ICD-10-CM

## 2021-11-18 DIAGNOSIS — I10 Essential (primary) hypertension: Secondary | ICD-10-CM

## 2021-11-18 DIAGNOSIS — R300 Dysuria: Secondary | ICD-10-CM

## 2021-11-18 DIAGNOSIS — Z0001 Encounter for general adult medical examination with abnormal findings: Secondary | ICD-10-CM

## 2021-11-26 ENCOUNTER — Other Ambulatory Visit: Payer: Self-pay

## 2021-11-26 DIAGNOSIS — N6489 Other specified disorders of breast: Secondary | ICD-10-CM

## 2021-12-03 ENCOUNTER — Other Ambulatory Visit: Payer: Self-pay | Admitting: Nurse Practitioner

## 2021-12-03 DIAGNOSIS — L989 Disorder of the skin and subcutaneous tissue, unspecified: Secondary | ICD-10-CM

## 2021-12-11 ENCOUNTER — Ambulatory Visit
Admission: RE | Admit: 2021-12-11 | Discharge: 2021-12-11 | Disposition: A | Discharge status: Home / Self Care | Payer: 59 | Source: Ambulatory Visit

## 2021-12-11 DIAGNOSIS — N6489 Other specified disorders of breast: Secondary | ICD-10-CM

## 2022-01-08 LAB — URINE CULTURE: Result:: NO GROWTH

## 2022-01-08 LAB — CBC
HCT: 40.9 % (ref 35.0–45.0)
Hemoglobin: 13.7 g/dL (ref 11.7–15.5)
MCH: 29.7 pg (ref 27.0–33.0)
MCHC: 33.5 g/dL (ref 32.0–36.0)
MCV: 88.7 fL (ref 80.0–100.0)
MPV: 10.2 fL (ref 7.5–12.5)
Platelets: 304 10*3/uL (ref 140–400)
RBC: 4.61 10*6/uL (ref 3.80–5.10)
RDW: 13.1 % (ref 11.0–15.0)
WBC: 8.9 10*3/uL (ref 3.8–10.8)

## 2022-01-08 LAB — LIPID PANEL
Cholesterol: 226 mg/dL — ABNORMAL HIGH (ref <200)
HDL: 55 mg/dL (ref >=50)
LDL Cholesterol (Calc): 146 mg/dL (calc) — ABNORMAL HIGH
Non-HDL Cholesterol (Calc): 171 mg/dL (calc) — ABNORMAL HIGH (ref <130)
Total CHOL/HDL Ratio: 4.1 (calc) (ref <5.0)
Triglycerides: 130 mg/dL (ref <150)

## 2022-01-08 LAB — COMPREHENSIVE METABOLIC PANEL
AG Ratio: 2 (calc) (ref 1.0–2.5)
ALT: 18 U/L (ref 6–29)
AST: 11 U/L (ref 10–35)
Albumin: 4.4 g/dL (ref 3.6–5.1)
Alkaline phosphatase (APISO): 67 U/L (ref 31–125)
BUN: 13 mg/dL (ref 7–25)
CO2: 25 mmol/L (ref 20–32)
Calcium: 8.9 mg/dL (ref 8.6–10.2)
Chloride: 104 mmol/L (ref 98–110)
Creat: 0.67 mg/dL (ref 0.50–0.99)
Globulin: 2.2 g/dL (calc) (ref 1.9–3.7)
Glucose, Bld: 97 mg/dL (ref 65–99)
Potassium: 4.4 mmol/L (ref 3.5–5.3)
Sodium: 136 mmol/L (ref 135–146)
Total Bilirubin: 0.6 mg/dL (ref 0.2–1.2)
Total Protein: 6.6 g/dL (ref 6.1–8.1)

## 2022-01-08 LAB — URINALYSIS, ROUTINE W REFLEX MICROSCOPIC
Bilirubin Urine: NEGATIVE
Glucose, UA: NEGATIVE
Hgb urine dipstick: NEGATIVE
Ketones, ur: NEGATIVE
Leukocytes,Ua: NEGATIVE
Nitrite: NEGATIVE
Protein, ur: NEGATIVE
Specific Gravity, Urine: 1.021 (ref 1.001–1.035)
pH: 5.5 (ref 5.0–8.0)

## 2022-01-08 LAB — TSH: TSH: 2.03 mIU/L

## 2022-01-08 LAB — HEMOGLOBIN A1C
Hgb A1c MFr Bld: 5.5 % of total Hgb (ref <5.7)
Mean Plasma Glucose: 111 mg/dL
eAG (mmol/L): 6.2 mmol/L

## 2022-01-13 ENCOUNTER — Other Ambulatory Visit: Payer: Self-pay | Admitting: Nurse Practitioner

## 2022-01-13 DIAGNOSIS — E039 Hypothyroidism, unspecified: Secondary | ICD-10-CM

## 2022-01-14 ENCOUNTER — Encounter: Payer: Self-pay | Admitting: Nurse Practitioner

## 2022-01-14 MED ORDER — ALPRAZOLAM 0.25 MG PO TABS: 0.2500 mg | ORAL_TABLET | Freq: Every day | ORAL | 0 refills | Status: DC | PRN

## 2022-01-14 MED ORDER — NP THYROID 60 MG PO TABS: 60.0000 mg | ORAL_TABLET | Freq: Every day | ORAL | 1 refills | Status: DC

## 2022-01-14 NOTE — Telephone Encounter (Signed)
Check [place] registry last filled 09/20/2021,,/lmb

## 2022-01-15 ENCOUNTER — Encounter: Payer: Self-pay | Admitting: Family Medicine

## 2022-01-15 ENCOUNTER — Other Ambulatory Visit: Payer: Self-pay

## 2022-01-15 DIAGNOSIS — G8929 Other chronic pain: Secondary | ICD-10-CM

## 2022-01-17 ENCOUNTER — Telehealth (INDEPENDENT_AMBULATORY_CARE_PROVIDER_SITE_OTHER): Payer: 59 | Admitting: Nurse Practitioner

## 2022-01-17 ENCOUNTER — Encounter: Payer: Self-pay | Admitting: Nurse Practitioner

## 2022-01-17 DIAGNOSIS — J019 Acute sinusitis, unspecified: Secondary | ICD-10-CM | POA: Diagnosis not present

## 2022-01-17 MED ORDER — AZITHROMYCIN 250 MG PO TABS: ORAL_TABLET | ORAL | 0 refills | Status: AC

## 2022-01-17 NOTE — Assessment & Plan Note (Signed)
Acute, most likely viral in origin. Patient going on vacation next week and would like to have antibiotic available to take if needed.  Patient is allergic to penicillin.  Z-Pak ordered for patient to take if symptoms persist past 10 days or if they worsen before then. Patient encouraged to test for COVID-19 infection, and if positive proceed to urgent care tomorrow to discuss possible treatment antiviral medication. Patient encouraged to continue taking Coricidin over-the-counter at home for symptom management as well as to focus on rest and hydration. Offered albuterol inhaler for as needed shortness of breath, however patient reports she does not feel like she is wheezing or feel exceptionally short of breath.  Additionally she has not tolerated albuterol in the past due to cardiac palpitations.  Per shared decision making we will not prescribe this for her at this time. Offered Tessalon Perles for further cough suppression however patient reports she does not feel cough is severe enough, thus prescription not sent. We will hold off on prescribing prednisone due to lack of symptom severity.

## 2022-01-17 NOTE — Telephone Encounter (Signed)
LM for pt requesting she give our office a call to schedule a VV with Judson Roch today to discuss sinus infection.

## 2022-01-17 NOTE — Progress Notes (Signed)
   Established Patient Office Visit An audio/visual tele-health visit was completed today for this patient. I connected with  Alyssa Vincent on 01/17/22 utilizing audio/visual technology and verified that I am speaking with the correct person using two identifiers. The patient was located at their place of employment, and I was located at the office of [facility] at [facility] during the encounter. I discussed the limitations of evaluation and management by telemedicine. The patient expressed understanding and agreed to proceed.     Subjective   Patient ID: Alyssa Vincent, female    DOB: Aug 30, 1970  Age: 51 y.o. MRN: [id number]  Chief Complaint  Patient presents with   Sinusitis    Patient arrives today for virtual visit for the above.  She reports that symptoms started approximately 4 days ago she is experiencing sinus pressure and some chest tightness.  She denies significant coughing, shortness of breath, wheezing, or fever.  She is taking Coricidin over-the-counter which has helped her symptoms mildly.  She reports having taken azithromycin in the past for similar symptoms which helped relieve them.  She has not yet taken a COVID-19 test.  She has been fully vaccinated.  She does have a history of asthma and obesity.    Review of Systems  HENT:  Positive for congestion and sinus pain.   Respiratory:  Negative for cough, shortness of breath and wheezing.   Cardiovascular:  Negative for chest pain.      Objective:     There were no vitals taken for this visit.   Physical Exam Comprehensive physical exam not completed today as office visit was conducted remotely.  Patient appears well over video and is not in any acute distress.  Patient was alert and oriented, and appeared to have appropriate judgment.   No results found for any visits on 01/17/22.    The ASCVD Risk score (Arnett DK, et al., 2019) failed to calculate for the following reasons:   The systolic blood  pressure is missing    Assessment & Plan:   Problem List Items Addressed This Visit       Respiratory   Acute non-recurrent sinusitis - Primary    Acute, most likely viral in origin. Patient going on vacation next week and would like to have antibiotic available to take if needed.  Patient is allergic to penicillin.  Z-Pak ordered for patient to take if symptoms persist past 10 days or if they worsen before then. Patient encouraged to test for COVID-19 infection, and if positive proceed to urgent care tomorrow to discuss possible treatment antiviral medication. Patient encouraged to continue taking Coricidin over-the-counter at home for symptom management as well as to focus on rest and hydration. Offered albuterol inhaler for as needed shortness of breath, however patient reports she does not feel like she is wheezing or feel exceptionally short of breath.  Additionally she has not tolerated albuterol in the past due to cardiac palpitations.  Per shared decision making we will not prescribe this for her at this time. Offered Tessalon Perles for further cough suppression however patient reports she does not feel cough is severe enough, thus prescription not sent. We will hold off on prescribing prednisone due to lack of symptom severity.      Relevant Medications   azithromycin (ZITHROMAX) 250 MG tablet    Return if symptoms worsen or fail to improve.    Ailene Ards, NP

## 2022-01-20 ENCOUNTER — Other Ambulatory Visit: Payer: Self-pay | Admitting: Nurse Practitioner

## 2022-02-02 ENCOUNTER — Encounter: Payer: Self-pay | Admitting: Nurse Practitioner

## 2022-02-03 ENCOUNTER — Other Ambulatory Visit: Payer: Self-pay

## 2022-02-03 MED ORDER — FEXOFENADINE HCL 180 MG PO TABS: 180.0000 mg | ORAL_TABLET | Freq: Every day | ORAL | 1 refills | Status: DC

## 2022-03-20 ENCOUNTER — Other Ambulatory Visit: Payer: Self-pay | Admitting: Nurse Practitioner

## 2022-03-20 DIAGNOSIS — I10 Essential (primary) hypertension: Secondary | ICD-10-CM

## 2022-04-02 ENCOUNTER — Other Ambulatory Visit: Payer: Self-pay

## 2022-04-02 DIAGNOSIS — I10 Essential (primary) hypertension: Secondary | ICD-10-CM

## 2022-04-02 NOTE — Telephone Encounter (Signed)
NOTE NOT NEEDED ?

## 2022-04-02 NOTE — Telephone Encounter (Signed)
I called to inform patient that the Rx was sent in to the request pharmacy.  FYI

## 2022-04-02 NOTE — Telephone Encounter (Signed)
Pt is calling she is leaving town this afternoon and will need this Rx.

## 2022-04-30 ENCOUNTER — Other Ambulatory Visit: Payer: Self-pay | Admitting: Nurse Practitioner

## 2022-04-30 DIAGNOSIS — F32A Depression, unspecified: Secondary | ICD-10-CM

## 2022-05-02 ENCOUNTER — Other Ambulatory Visit: Payer: Self-pay

## 2022-05-02 ENCOUNTER — Encounter: Payer: Self-pay | Admitting: Nurse Practitioner

## 2022-05-05 ENCOUNTER — Telehealth: Payer: Self-pay | Admitting: Nurse Practitioner

## 2022-05-05 NOTE — Telephone Encounter (Signed)
Pt called in follow up from her MyChart message sent on Friday, 05/02/22, requesting refills.  Sertraline and Montelukast  Next Appointment:  05/09/22.  Last Appointment:  11/01/21  Preferred Pharmacy: [id number] [id number]: [phone] Fax: [phone]  Please call pt to confirm or update: [phone]

## 2022-05-07 ENCOUNTER — Other Ambulatory Visit: Payer: Self-pay

## 2022-05-07 DIAGNOSIS — F32A Depression, unspecified: Secondary | ICD-10-CM

## 2022-05-07 MED ORDER — SERTRALINE HCL 50 MG PO TABS: 50.0000 mg | ORAL_TABLET | Freq: Every day | ORAL | 1 refills | Status: DC

## 2022-05-07 MED ORDER — MONTELUKAST SODIUM 10 MG PO TABS: ORAL_TABLET | ORAL | 1 refills | Status: DC

## 2022-05-07 NOTE — Telephone Encounter (Signed)
Pt request medication has been sent

## 2022-05-09 ENCOUNTER — Ambulatory Visit: Payer: 59 | Admitting: Nurse Practitioner

## 2022-05-09 VITALS — BP 134/82 | HR 79 | Temp 98.0°F | Ht 62.0 in | Wt 207.5 lb

## 2022-05-09 DIAGNOSIS — E785 Hyperlipidemia, unspecified: Secondary | ICD-10-CM | POA: Diagnosis not present

## 2022-05-09 DIAGNOSIS — R0681 Apnea, not elsewhere classified: Secondary | ICD-10-CM

## 2022-05-09 DIAGNOSIS — I10 Essential (primary) hypertension: Secondary | ICD-10-CM | POA: Diagnosis not present

## 2022-05-09 NOTE — Patient Instructions (Addendum)
[facility]: [phone] option 1 - call them to schedule     FIA - females in action

## 2022-05-09 NOTE — Assessment & Plan Note (Signed)
Chronic, stable. Continue losartan '50mg'$ /day.

## 2022-05-09 NOTE — Assessment & Plan Note (Signed)
Chronic, low ASCVD risk score, encouraged healthy lifestyle with heart healthy diet and exercise. Patient reports understanding.

## 2022-05-09 NOTE — Progress Notes (Signed)
   Established Patient Office Visit  Subjective   Patient ID: Alyssa Vincent, female    DOB: 01/14/1971  Age: 51 y.o. MRN: [id number]  Chief Complaint  Patient presents with   Snoring   HTN/HLD: on losartan. Last LDL 146, ASCVD risk score around 2%.  Snoring/Witnessed apnea: Has had witnessed apnea by ex-husband in the past. Snores now. No nocturia, feels refreshed upon waking, no need for naps during day.      Review of Systems  HENT:         (+) snores  Respiratory:  Negative for shortness of breath.   Cardiovascular:  Negative for chest pain.  Neurological:  Negative for headaches.  Psychiatric/Behavioral:  The patient has insomnia.       Objective:     BP 134/82   Pulse 79   Temp 98 F (36.7 C) (Oral)   Ht '5\' 2"'$  (1.575 m)   Wt 207 lb 8 oz (94.1 kg)   SpO2 96%   BMI 37.95 kg/m  BP Readings from Last 3 Encounters:  05/09/22 134/82  11/01/21 120/80  10/15/21 (!) 136/94   Wt Readings from Last 3 Encounters:  05/09/22 207 lb 8 oz (94.1 kg)  11/01/21 192 lb (87.1 kg)  10/15/21 200 lb (90.7 kg)      Physical Exam Vitals reviewed.  Constitutional:      General: She is not in acute distress.    Appearance: Normal appearance.  HENT:     Head: Normocephalic and atraumatic.  Neck:     Vascular: No carotid bruit.  Cardiovascular:     Rate and Rhythm: Normal rate and regular rhythm.     Pulses: Normal pulses.     Heart sounds: Normal heart sounds.  Pulmonary:     Effort: Pulmonary effort is normal.     Breath sounds: Normal breath sounds.  Skin:    General: Skin is warm and dry.  Neurological:     General: No focal deficit present.     Mental Status: She is alert and oriented to person, place, and time.  Psychiatric:        Mood and Affect: Mood normal.        Behavior: Behavior normal.        Judgment: Judgment normal.      No results found for any visits on 05/09/22.    The 10-year ASCVD risk score (Arnett DK, et al., 2019) is: 2%     Assessment & Plan:   Problem List Items Addressed This Visit       Cardiovascular and Mediastinum   Essential hypertension, benign    Chronic, stable. Continue losartan '50mg'$ /day.         Other   HLD (hyperlipidemia)    Chronic, low ASCVD risk score, encouraged healthy lifestyle with heart healthy diet and exercise. Patient reports understanding.       Witnessed apneic spells - Primary    Refer to neurology to consider undergoing sleep study      Relevant Orders   Ambulatory referral to Neurology    Return in about 6 months (around 11/07/2022) for CPE in 6 months with Judson Roch.    Ailene Ards, NP

## 2022-05-09 NOTE — Assessment & Plan Note (Signed)
Refer to neurology to consider undergoing sleep study

## 2022-06-06 ENCOUNTER — Telehealth: Payer: Self-pay | Admitting: Nurse Practitioner

## 2022-06-06 NOTE — Telephone Encounter (Signed)
Please request patient's previous medical records from [facility], there should be a signed ROI form in patient's media folder.

## 2022-06-09 ENCOUNTER — Ambulatory Visit: Payer: 59 | Admitting: Dermatology

## 2022-06-09 DIAGNOSIS — D489 Neoplasm of uncertain behavior, unspecified: Secondary | ICD-10-CM

## 2022-06-09 DIAGNOSIS — L820 Inflamed seborrheic keratosis: Secondary | ICD-10-CM | POA: Diagnosis not present

## 2022-06-09 DIAGNOSIS — D239 Other benign neoplasm of skin, unspecified: Secondary | ICD-10-CM

## 2022-06-09 DIAGNOSIS — D2372 Other benign neoplasm of skin of left lower limb, including hip: Secondary | ICD-10-CM | POA: Diagnosis not present

## 2022-06-09 DIAGNOSIS — D1724 Benign lipomatous neoplasm of skin and subcutaneous tissue of left leg: Secondary | ICD-10-CM | POA: Diagnosis not present

## 2022-06-09 DIAGNOSIS — D229 Melanocytic nevi, unspecified: Secondary | ICD-10-CM | POA: Diagnosis not present

## 2022-06-09 DIAGNOSIS — D1801 Hemangioma of skin and subcutaneous tissue: Secondary | ICD-10-CM

## 2022-06-09 DIAGNOSIS — L814 Other melanin hyperpigmentation: Secondary | ICD-10-CM | POA: Diagnosis not present

## 2022-06-09 DIAGNOSIS — L821 Other seborrheic keratosis: Secondary | ICD-10-CM

## 2022-06-09 NOTE — Progress Notes (Signed)
New Patient Visit  Subjective  Alyssa Vincent is a 51 y.o. female who presents for the following: New Patient (Initial Visit) (Patient here today concerning a spot at left lower leg reports had removed in 2006 and has came back. She also has several spots on legs, back, and chest she is concerned with. ). The patient has spots, moles and lesions to be evaluated, some may be new or changing and the patient has concerns that these could be cancer.  The following portions of the chart were reviewed this encounter and updated as appropriate:   Tobacco  Allergies  Meds  Problems  Med Hx  Surg Hx  Fam Hx     Review of Systems:  No other skin or systemic complaints except as noted in HPI or Assessment and Plan.  Objective  Well appearing patient in no apparent distress; mood and affect are within normal limits.  A focused examination was performed including face, back, chest, b/l arms, b/l legs, . Relevant physical exam findings are noted in the Assessment and Plan.  Left Thigh - Posterior 1.1 cm flesh colored papule        left medial cheek x 1, right medial cheek x 1, left forehead x 1 (3) Erythematous stuck-on, waxy papule or plaque   Assessment & Plan  Neoplasm of uncertain behavior Left Thigh - Posterior Epidermal / dermal shaving  Lesion diameter (cm):  1.1 Informed consent: discussed and consent obtained   Timeout: patient name, date of birth, surgical site, and procedure verified   Procedure prep:  Patient was prepped and draped in usual sterile fashion Prep type:  Isopropyl alcohol Anesthesia: the lesion was anesthetized in a standard fashion   Anesthetic:  1% lidocaine w/ epinephrine 1-100,000 buffered w/ 8.4% NaHCO3 Instrument used: flexible razor blade   Hemostasis achieved with: pressure, aluminum chloride and electrodesiccation   Outcome: patient tolerated procedure well   Post-procedure details: sterile dressing applied and wound care instructions given    Dressing type: bandage and petrolatum    Specimen 1 - Surgical pathology Differential Diagnosis: R/o irritated nevus r/o dysplasia  Check Margins: No R/o irritated nevus r/o dysplasia   Inflamed seborrheic keratosis (3) left medial cheek x 1, right medial cheek x 1, left forehead x 1 Symptomatic, irritating, patient would like treated. Destruction of lesion - left medial cheek x 1, right medial cheek x 1, left forehead x 1 Complexity: simple   Destruction method: cryotherapy   Informed consent: discussed and consent obtained   Timeout:  patient name, date of birth, surgical site, and procedure verified Lesion destroyed using liquid nitrogen: Yes   Region frozen until ice ball extended beyond lesion: Yes   Outcome: patient tolerated procedure well with no complications   Post-procedure details: wound care instructions given   Additional details:  Prior to procedure, discussed risks of blister formation, small wound, skin dyspigmentation, or rare scar following cryotherapy. Recommend Vaseline ointment to treated areas while healing.  Seborrheic Keratoses - Stuck-on, waxy, tan-brown papules and/or plaques  - Benign-appearing - Discussed benign etiology and prognosis. - Observe - Call for any changes  Lentigines - Scattered tan macules - Due to sun exposure - Benign-appearing, observe - Recommend daily broad spectrum sunscreen SPF 30+ to sun-exposed areas, reapply every 2 hours as needed. - Call for any changes  Melanocytic Nevi - Tan-brown and/or pink-flesh-colored symmetric macules and papules - Benign appearing on exam today - Observation - Call clinic for new or changing moles - Recommend daily  use of broad spectrum spf 30+ sunscreen to sun-exposed areas.   Hemangiomas - Red papules - Discussed benign nature - Observe - Call for any changes  Dermatofibroma At above left lateral ankle 0.7 cm  Left ant lateral thigh 0.7 cm  Left medial thigh 0.5 cm  - Firm  pink/brown papulenodule with dimple sign - Benign appearing - Call for any changes  Return if symptoms worsen or fail to improve.  IRuthell Rummage, CMA, am acting as scribe for Sarina Ser, MD. Documentation: I have reviewed the above documentation for accuracy and completeness, and I agree with the above.  Sarina Ser, MD

## 2022-06-09 NOTE — Patient Instructions (Addendum)
Biopsy Wound Care Instructions  Leave the original bandage on for 24 hours if possible.  If the bandage becomes soaked or soiled before that time, it is OK to remove it and examine the wound.  A small amount of post-operative bleeding is normal.  If excessive bleeding occurs, remove the bandage, place gauze over the site and apply continuous pressure (no peeking) over the area for 30 minutes. If this does not work, please call our clinic as soon as possible or page your doctor if it is after hours.   Once a day, cleanse the wound with soap and water. It is fine to shower. If a thick crust develops you may use a Q-tip dipped into dilute hydrogen peroxide (mix 1:1 with water) to dissolve it.  Hydrogen peroxide can slow the healing process, so use it only as needed.    After washing, apply petroleum jelly (Vaseline) or an antibiotic ointment if your doctor prescribed one for you, followed by a bandage.    For best healing, the wound should be covered with a layer of ointment at all times. If you are not able to keep the area covered with a bandage to hold the ointment in place, this may mean re-applying the ointment several times a day.  Continue this wound care until the wound has healed and is no longer open.   Itching and mild discomfort is normal during the healing process. However, if you develop pain or severe itching, please call our office.   If you have any discomfort, you can take Tylenol (acetaminophen) or ibuprofen as directed on the bottle. (Please do not take these if you have an allergy to them or cannot take them for another reason).  Some redness, tenderness and white or yellow material in the wound is normal healing.  If the area becomes very sore and red, or develops a thick yellow-green material (pus), it may be infected; please notify us.    If you have stitches, return to clinic as directed to have the stitches removed. You will continue wound care for 2-3 days after the stitches  are removed.   Wound healing continues for up to one year following surgery. It is not unusual to experience pain in the scar from time to time during the interval.  If the pain becomes severe or the scar thickens, you should notify the office.    A slight amount of redness in a scar is expected for the first six months.  After six months, the redness will fade and the scar will soften and fade.  The color difference becomes less noticeable with time.  If there are any problems, return for a post-op surgery check at your earliest convenience.  To improve the appearance of the scar, you can use silicone scar gel, cream, or sheets (such as Mederma or Serica) every night for up to one year. These are available over the counter (without a prescription).  Please call our office at [phone] for any questions or concerns.       Cryotherapy Aftercare  Wash gently with soap and water everyday.   Apply Vaseline and Band-Aid daily until healed.      Seborrheic Keratosis  What causes seborrheic keratoses? Seborrheic keratoses are harmless, common skin growths that first appear during adult life.  As time goes by, more growths appear.  Some people may develop a large number of them.  Seborrheic keratoses appear on both covered and uncovered body parts.  They are not caused by  sunlight.  The tendency to develop seborrheic keratoses can be inherited.  They vary in color from skin-colored to gray, brown, or even black.  They can be either smooth or have a rough, warty surface.   Seborrheic keratoses are superficial and look as if they were stuck on the skin.  Under the microscope this type of keratosis looks like layers upon layers of skin.  That is why at times the top layer may seem to fall off, but the rest of the growth remains and re-grows.    Treatment Seborrheic keratoses do not need to be treated, but can easily be removed in the office.  Seborrheic keratoses often cause symptoms when they  rub on clothing or jewelry.  Lesions can be in the way of shaving.  If they become inflamed, they can cause itching, soreness, or burning.  Removal of a seborrheic keratosis can be accomplished by freezing, burning, or surgery. If any spot bleeds, scabs, or grows rapidly, please return to have it checked, as these can be an indication of a skin cancer.   Melanoma ABCDEs  Melanoma is the most dangerous type of skin cancer, and is the leading cause of death from skin disease.  You are more likely to develop melanoma if you: Have light-colored skin, light-colored eyes, or red or blond hair Spend a lot of time in the sun Tan regularly, either outdoors or in a tanning bed Have had blistering sunburns, especially during childhood Have a close family member who has had a melanoma Have atypical moles or large birthmarks  Early detection of melanoma is key since treatment is typically straightforward and cure rates are extremely high if we catch it early.   The first sign of melanoma is often a change in a mole or a new dark spot.  The ABCDE system is a way of remembering the signs of melanoma.  A for asymmetry:  The two halves do not match. B for border:  The edges of the growth are irregular. C for color:  A mixture of colors are present instead of an even brown color. D for diameter:  Melanomas are usually (but not always) greater than 51m - the size of a pencil eraser. E for evolution:  The spot keeps changing in size, shape, and color.  Please check your skin once per month between visits. You can use a small mirror in front and a large mirror behind you to keep an eye on the back side or your body.   If you see any new or changing lesions before your next follow-up, please call to schedule a visit.  Please continue daily skin protection including broad spectrum sunscreen SPF 30+ to sun-exposed areas, reapplying every 2 hours as needed when you're outdoors.   Staying in the shade or wearing  long sleeves, sun glasses (UVA+UVB protection) and wide brim hats (4-inch brim around the entire circumference of the hat) are also recommended for sun protection.    Due to recent changes in healthcare laws, you may see results of your pathology and/or laboratory studies on MyChart before the doctors have had a chance to review them. We understand that in some cases there may be results that are confusing or concerning to you. Please understand that not all results are received at the same time and often the doctors may need to interpret multiple results in order to provide you with the best plan of care or course of treatment. Therefore, we ask that you please give [place]  2 business days to thoroughly review all your results before contacting the office for clarification. Should we see a critical lab result, you will be contacted sooner.   If You Need Anything After Your Visit  If you have any questions or concerns for your doctor, please call our main line at [phone] and press option 4 to reach your doctor's medical assistant. If no one answers, please leave a voicemail as directed and we will return your call as soon as possible. Messages left after 4 pm will be answered the following business day.   You may also send [place] a message via [place]. We typically respond to MyChart messages within 1-2 business days.  For prescription refills, please ask your pharmacy to contact our office. Our fax number is [phone].  If you have an urgent issue when the clinic is closed that cannot wait until the next business day, you can page your doctor at the number below.    Please note that while we do our best to be available for urgent issues outside of office hours, we are not available 24/7.   If you have an urgent issue and are unable to reach [place], you may choose to seek medical care at your doctor's office, retail clinic, urgent care center, or emergency room.  If you have a medical emergency, please  immediately call 911 or go to the emergency department.  Pager Numbers  - Dr. Nehemiah Massed: [phone]  - Dr. Laurence Ferrari: [phone]  - Dr. Nicole Kindred: [phone]  In the event of inclement weather, please call our main line at [phone] for an update on the status of any delays or closures.  Dermatology Medication Tips: Please keep the boxes that topical medications come in in order to help keep track of the instructions about where and how to use these. Pharmacies typically print the medication instructions only on the boxes and not directly on the medication tubes.   If your medication is too expensive, please contact our office at [phone] option 4 or send [place] a message through [place].   We are unable to tell what your co-pay for medications will be in advance as this is different depending on your insurance coverage. However, we may be able to find a substitute medication at lower cost or fill out paperwork to get insurance to cover a needed medication.   If a prior authorization is required to get your medication covered by your insurance company, please allow [place] 1-2 business days to complete this process.  Drug prices often vary depending on where the prescription is filled and some pharmacies may offer cheaper prices.  The website www.goodrx.com contains coupons for medications through different pharmacies. The prices here do not account for what the cost may be with help from insurance (it may be cheaper with your insurance), but the website can give you the price if you did not use any insurance.  - You can print the associated coupon and take it with your prescription to the pharmacy.  - You may also stop by our office during regular business hours and pick up a GoodRx coupon card.  - If you need your prescription sent electronically to a different pharmacy, notify our office through [facility] or by phone at [phone] option 4.     Si Usted Necesita Algo Despus  de Su Visita  Tambin puede enviarnos un mensaje a travs de Pharmacist, community. Por lo general respondemos a los mensajes de MyChart en el transcurso de 1 a 2  das hbiles.  Para renovar recetas, por favor pida a su farmacia que se ponga en contacto con nuestra oficina. Harland Dingwall de fax es [place] [phone].  Si tiene un asunto urgente cuando la clnica est cerrada y que no puede esperar hasta el siguiente da hbil, puede llamar/localizar a su doctor(a) al nmero que aparece a continuacin.   Por favor, tenga en cuenta que aunque hacemos todo lo posible para estar disponibles para asuntos urgentes fuera del horario de [place], no estamos disponibles las 24 horas del da, los 7 das de la [place].   Si tiene un problema urgente y no puede comunicarse con nosotros, puede optar por buscar atencin mdica  en el consultorio de su doctor(a), en una clnica privada, en un centro de atencin urgente o en una sala de emergencias.  Si tiene Engineering geologist, por favor llame inmediatamente al 911 o vaya a la sala de emergencias.  Nmeros de bper  - Dr. Nehemiah Massed: [phone]  - Dra. Moye: [phone]  - Dra. Nicole Kindred: [phone]  En caso de inclemencias del [place], por favor llame a Johnsie Kindred principal al [phone] para una actualizacin sobre el [employer] de cualquier retraso o cierre.  Consejos para la medicacin en dermatologa: Por favor, guarde las cajas en las que vienen los medicamentos de uso tpico para ayudarle a seguir las instrucciones sobre dnde y cmo usarlos. Las farmacias generalmente imprimen las instrucciones del medicamento slo en las cajas y no directamente en los tubos del [place].   Si su medicamento es muy caro, por favor, pngase en contacto con Zigmund Daniel llamando al [phone] y presione la opcin 4 o envenos un mensaje a travs de Pharmacist, community.   No podemos decirle cul ser su copago por los medicamentos por adelantado ya que esto es diferente dependiendo  de la cobertura de su seguro. Sin embargo, es posible que podamos encontrar un medicamento sustituto a Electrical engineer un formulario para que el seguro cubra el medicamento que se considera necesario.   Si se requiere una autorizacin previa para que su compaa de seguros [place] su medicamento, por favor permtanos de 1 a 2 das hbiles para completar este proceso.  Los precios de los medicamentos varan con frecuencia dependiendo del Environmental consultant de dnde se surte la receta y alguna farmacias pueden ofrecer precios ms baratos.  El sitio web www.goodrx.com tiene cupones para medicamentos de Airline pilot. Los precios aqu no tienen en cuenta lo que podra costar con la ayuda del seguro (puede ser ms barato con su seguro), pero el sitio web puede darle el precio si no utiliz Research scientist (physical sciences).  - Puede imprimir el cupn correspondiente y llevarlo con su receta a la farmacia.  - Tambin puede pasar por nuestra oficina durante el horario de atencin regular y Charity fundraiser una tarjeta de cupones de GoodRx.  - Si necesita que su receta se enve electrnicamente a una farmacia diferente, informe a nuestra oficina a travs de MyChart de [place] o por telfono llamando al [phone] y presione la opcin 4.

## 2022-06-11 NOTE — Telephone Encounter (Signed)
ROI form refaxed. Confirmation fax received.

## 2022-06-16 ENCOUNTER — Telehealth: Payer: Self-pay

## 2022-06-16 NOTE — Telephone Encounter (Signed)
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

## 2022-06-16 NOTE — Telephone Encounter (Signed)
Advised pt of bx result/sh ?

## 2022-06-17 ENCOUNTER — Encounter: Payer: Self-pay | Admitting: Dermatology

## 2022-07-05 ENCOUNTER — Other Ambulatory Visit: Payer: Self-pay | Admitting: Internal Medicine

## 2022-07-05 DIAGNOSIS — E039 Hypothyroidism, unspecified: Secondary | ICD-10-CM

## 2022-07-29 ENCOUNTER — Ambulatory Visit: Payer: 59 | Admitting: Neurology

## 2022-07-29 ENCOUNTER — Encounter: Payer: Self-pay | Admitting: Neurology

## 2022-07-29 VITALS — BP 131/86 | HR 77 | Ht 62.0 in | Wt 211.0 lb

## 2022-07-29 DIAGNOSIS — G4719 Other hypersomnia: Secondary | ICD-10-CM | POA: Diagnosis not present

## 2022-07-29 DIAGNOSIS — Z9189 Other specified personal risk factors, not elsewhere classified: Secondary | ICD-10-CM

## 2022-07-29 DIAGNOSIS — E669 Obesity, unspecified: Secondary | ICD-10-CM

## 2022-07-29 DIAGNOSIS — R0681 Apnea, not elsewhere classified: Secondary | ICD-10-CM

## 2022-07-29 DIAGNOSIS — K219 Gastro-esophageal reflux disease without esophagitis: Secondary | ICD-10-CM

## 2022-07-29 DIAGNOSIS — R0683 Snoring: Secondary | ICD-10-CM

## 2022-07-29 NOTE — Patient Instructions (Signed)

## 2022-07-29 NOTE — Progress Notes (Signed)
Subjective:    Patient ID: Alyssa Vincent is a 52 y.o. female.  HPI    Star Age, MD, PhD [facility] [address], [address] [place], [place] [address]  Dear Judson Roch,  I saw your patient, Alyssa Vincent, upon your kind request in my sleep clinic today for initial consultation of her sleep disorder, in particular, concern for underlying obstructive sleep apnea.  The patient is unaccompanied today.  As you know, Alyssa Vincent is a 52 year old female with an underlying medical history of anemia, arthritis, asthma, anxiety, depression, reflux disease, hypertension, hypothyroidism, and obesity, who reports snoring and excessive daytime somnolence as well as report of apneic pauses while asleep.  Her Epworth sleepiness score is 3 out of 24, fatigue severity score is 27 out of 63.  She is status post tonsillectomy.  I reviewed your office visit note from 05/09/2022.  Her snoring is quite consistent throughout the night, she is divorced and lives alone but has recorded her snoring on a smart phone app.  She denies recurrent nocturnal or morning headaches, she does not have night to night nocturia.  She does like to sleep a little elevated, on a wedge pillow because of reflux symptoms.  She is working on weight loss.  She is a side sleeper.  She works for [employer] as a Charity fundraiser.  Bedtime is generally between 10 and 11 PM and rise time around 6 AM.  She is trying to exercise on a regular basis.  She is a non-smoker, she drinks limited caffeine in the form of hot tea, usually 1 cup and has reduced her soda intake, she drinks alcohol very occasionally.  She does not have a TV on in her bedroom.  She does not currently have any pets.  She has 2 grown daughters and 2 grandchildren.  She also uses a white noise app on her phone at night.  Her Past Medical History Is Significant For: Past Medical History:  Diagnosis Date   Anemia    Anxiety    Anxiety attack 11/19/2020    Arthritis    Asthma    Depression 11/19/2020   Essential hypertension, benign 11/19/2020   GERD (gastroesophageal reflux disease)    Hypertension    Hypothyroidism (acquired) 11/19/2020   Thyroid disease    hypothyroidism    Her Past Surgical History Is Significant For: Past Surgical History:  Procedure Laterality Date   BREAST SURGERY Bilateral 2006   Augmentation -saline   HERNIA REPAIR     Umbilical   TONSILLECTOMY      Her Family History Is Significant For: Family History  Problem Relation Age of Onset   GER disease Mother    Diabetes Father    Cancer Father    Lung cancer Father    Anxiety disorder Father    Sleep apnea Father     Her Social History Is Significant For: Social History   Socioeconomic History   Marital status: Divorced    Spouse name: Not on file   Number of children: 2   Years of education: Not on file   Highest education level: Not on file  Occupational History   Not on file  Tobacco Use   Smoking status: Never   Smokeless tobacco: Never  Vaping Use   Vaping Use: Never used  Substance and Sexual Activity   Alcohol use: Yes    Comment: occ   Drug use: Never   Sexual activity: Not Currently    Birth control/protection:  I.U.D.  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   Social Determinants of Health   Financial Resource Strain: Not on file  Food Insecurity: Not on file  Transportation Needs: Not on file  Physical Activity: Not on file  Stress: Not on file  Social Connections: Not on file    Her Allergies Are:  Allergies  Allergen Reactions   Penicillins Diarrhea, Hives, Nausea Only and Swelling   Codeine Hives, Itching, Rash and Swelling  :   Her Current Medications Are:  Outpatient Encounter Medications as of 07/29/2022  Medication Sig   ALPRAZolam (XANAX) 0.25 MG tablet Take 1 tablet (0.25 mg total) by mouth  daily as needed for anxiety.   cyclobenzaprine (FLEXERIL) 10 MG tablet Take 1 tablet (10 mg total) by mouth as needed for muscle spasms.   famotidine (PEPCID) 20 MG tablet Take 20 mg by mouth 2 (two) times daily.   fexofenadine (ALLEGRA) 180 MG tablet TAKE 1 TABLET BY MOUTH EVERY DAY   fexofenadine (ALLEGRA) 180 MG tablet Take 1 tablet (180 mg total) by mouth daily.   levonorgestrel (MIRENA) 20 MCG/DAY IUD 1 each by Intrauterine route once.   losartan (COZAAR) 50 MG tablet TAKE 1 TABLET BY MOUTH EVERY DAY   montelukast (SINGULAIR) 10 MG tablet TAKE 1 TABLET BY MOUTH EVERYDAY AT BEDTIME   NP THYROID 60 MG tablet TAKE 1 TABLET BY MOUTH ONCE DAILY BEFORE BREAKFAST   sertraline (ZOLOFT) 50 MG tablet Take 1 tablet (50 mg total) by mouth daily.   No facility-administered encounter medications on file as of 07/29/2022.  :   Review of Systems:  Out of a complete 14 point review of systems, all are reviewed and negative with the exception of these symptoms as listed below:  Review of Systems  Neurological:        Pt here for sleep consult Pt snores,fatigue Pt denies hypertension,headaches,sleep study,CPAP    ESS FSS:27    Objective:  Neurological Exam  Physical Exam Physical Examination:   Vitals:   07/29/22 1533 07/29/22 1540  BP: (!) 144/89 131/86  Pulse: 81 77    General Examination: The patient is a very pleasant 52 y.o. female in no acute distress. She appears well-developed and well-nourished and well groomed.   HEENT: Normocephalic, atraumatic, pupils are equal, round and reactive to light, extraocular tracking is good without limitation to gaze excursion or nystagmus noted. Hearing is grossly intact. Face is symmetric with normal facial animation. Speech is clear with no dysarthria noted. There is no hypophonia. There is no lip, neck/head, jaw or voice tremor. Neck is supple with full range of passive and active motion. There are no carotid bruits on auscultation. Oropharynx  exam reveals: mild mouth dryness, good dental hygiene and mild airway crowding, due to smaller airway entry, tonsils absent, Mallampati class II, neck circumference 16-1/2 inches, no significant overbite.  Tongue protrudes centrally and palate elevates symmetrically.  Chest: Clear to auscultation without wheezing, rhonchi or crackles noted.  Heart: S1+S2+0, regular and normal without murmurs, rubs or gallops noted.   Abdomen: Soft, non-tender and non-distended.  Extremities: There is no pitting edema in the distal lower extremities bilaterally.   Skin: Warm and dry without trophic changes noted.   Musculoskeletal: exam reveals no obvious joint deformities.   Neurologically:  Mental status: The patient is awake, alert and oriented in all 4 spheres. Her immediate and remote memory,  attention, language skills and fund of knowledge are appropriate. There is no evidence of aphasia, agnosia, apraxia or anomia. Speech is clear with normal prosody and enunciation. Thought process is linear. Mood is normal and affect is normal.  Cranial nerves II - XII are as described above under HEENT exam.  Motor exam: Normal bulk, strength and tone is noted. There is no obvious action or resting tremor.  Fine motor skills and coordination: grossly intact.  Cerebellar testing: No dysmetria or intention tremor. There is no truncal or gait ataxia.  Sensory exam: intact to light touch in the upper and lower extremities.  Gait, station and balance: She stands easily. No veering to one side is noted. No leaning to one side is noted. Posture is age-appropriate and stance is narrow based. Gait shows normal stride length and normal pace. No problems turning are noted.   Assessment and Plan:  In summary, Alyssa Vincent is a very pleasant 52 y.o.-year old female with an underlying medical history of anemia, arthritis, asthma, anxiety, depression, reflux disease, hypertension, hypothyroidism, and obesity, whose history and  physical exam are concerning for sleep disordered breathing, supporting a current working diagnosis of unspecified sleep apnea, with the main differential diagnoses of obstructive sleep apnea (OSA) versus upper airway resistance syndrome (UARS) versus central sleep apnea (CSA), or mixed sleep apnea. A laboratory attended sleep study is typically considered "gold standard" for evaluation of sleep disordered breathing.   I had a long chat with the patient about my findings and the diagnosis of sleep apnea, particularly OSA, its prognosis and treatment options. We talked about medical/conservative treatments, surgical interventions and non-pharmacological approaches for symptom control. I explained, in particular, the risks and ramifications of untreated moderate to severe OSA, especially with respect to developing cardiovascular disease down the road, including congestive heart failure (CHF), difficult to treat hypertension, cardiac arrhythmias (particularly A-fib), neurovascular complications including TIA, stroke and dementia. Even type 2 diabetes has, in part, been linked to untreated OSA. Symptoms of untreated OSA may include (but may not be limited to) daytime sleepiness, nocturia (i.e. frequent nighttime urination), memory problems, mood irritability and suboptimally controlled or worsening mood disorder such as depression and/or anxiety, lack of energy, lack of motivation, physical discomfort, as well as recurrent headaches, especially morning or nocturnal headaches. We talked about the importance of maintaining a healthy lifestyle and striving for healthy weight. In addition, we talked about the importance of striving for and maintaining good sleep hygiene. I recommended a sleep study at this time. I outlined the differences between a laboratory attended sleep study which is considered more comprehensive and accurate over the option of a home sleep test (HST); the latter may lead to underestimation of sleep  disordered breathing in some instances and does not help with diagnosing upper airway resistance syndrome and is not accurate enough to diagnose primary central sleep apnea typically. I outlined possible surgical and non-surgical treatment options of OSA, including the use of a positive airway pressure (PAP) device (i.e. CPAP, AutoPAP/APAP or BiPAP in certain circumstances), a custom-made dental device (aka oral appliance, which would require a referral to a specialist dentist or orthodontist typically, and is generally speaking not considered for patients with full dentures or edentulous state), upper airway surgical options, such as traditional UPPP (which is not considered a first-line treatment) or the Inspire device (hypoglossal nerve stimulator, which would involve a referral for consultation with an ENT surgeon, after careful selection, following inclusion criteria - also not first-line treatment). I explained  the PAP treatment option to the patient in detail, as this is generally considered first-line treatment.  The patient indicated that she would be willing to try PAP therapy, if the need arises. I explained the importance of being compliant with PAP treatment, not only for insurance purposes but primarily to improve patient's symptoms symptoms, and for the patient's long term health benefit, including to reduce Her cardiovascular risks longer-term.    We will pick up our discussion about the next steps and treatment options after testing.  We will keep her posted as to the test results by phone call and/or MyChart messaging where possible.  We will plan to follow-up in sleep clinic accordingly as well.  I answered all her questions today and the patient was in agreement.   I encouraged her to call with any interim questions, concerns, problems or updates or email [place] through [place].  Generally speaking, sleep test authorizations may take up to 2 weeks, sometimes less, sometimes longer, the patient  is encouraged to get in touch with [place] if they do not hear back from the sleep lab staff directly within the next 2 weeks.  Thank you very much for allowing me to participate in the care of this nice patient. If I can be of any further assistance to you please do not hesitate to call me at [phone].  Sincerely,   Star Age, MD, PhD

## 2022-08-14 ENCOUNTER — Telehealth: Payer: Self-pay | Admitting: Neurology

## 2022-08-14 NOTE — Telephone Encounter (Signed)
left VM 08/13/22 [place]  [employer] no auth req EE

## 2022-08-20 ENCOUNTER — Other Ambulatory Visit: Payer: Self-pay | Admitting: Nurse Practitioner

## 2022-08-20 DIAGNOSIS — I10 Essential (primary) hypertension: Secondary | ICD-10-CM

## 2022-08-28 ENCOUNTER — Encounter: Payer: Self-pay | Admitting: Radiology

## 2022-09-10 ENCOUNTER — Encounter: Payer: Self-pay | Admitting: Nurse Practitioner

## 2022-09-10 ENCOUNTER — Other Ambulatory Visit: Payer: Self-pay | Admitting: Internal Medicine

## 2022-09-10 ENCOUNTER — Other Ambulatory Visit: Payer: Self-pay | Admitting: Nurse Practitioner

## 2022-09-10 DIAGNOSIS — E039 Hypothyroidism, unspecified: Secondary | ICD-10-CM

## 2022-09-10 MED ORDER — NP THYROID 60 MG PO TABS: 60.0000 mg | ORAL_TABLET | Freq: Every day | ORAL | 1 refills | Status: DC

## 2022-09-11 ENCOUNTER — Other Ambulatory Visit: Payer: Self-pay

## 2022-09-11 DIAGNOSIS — I10 Essential (primary) hypertension: Secondary | ICD-10-CM

## 2022-09-11 MED ORDER — LOSARTAN POTASSIUM 50 MG PO TABS: 50.0000 mg | ORAL_TABLET | Freq: Every day | ORAL | 1 refills | Status: DC

## 2022-09-11 MED ORDER — ALPRAZOLAM 0.25 MG PO TABS: 0.2500 mg | ORAL_TABLET | Freq: Every day | ORAL | 0 refills | Status: AC | PRN

## 2022-09-11 NOTE — Telephone Encounter (Signed)
Prescription Request  09/11/2022  LOV: 05/09/2022  What is the name of the medication or equipment? Losartan '50mg'$   Have you contacted your pharmacy to request a refill? Yes   Which pharmacy would you like this sent to?  [id number]: [phone]: [phone]  Patient notified that their request is being sent to the clinical staff for review and that they should receive a response within 2 business days.   Please advise at Mobile [phone](mobile)
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
## 2022-10-31 ENCOUNTER — Other Ambulatory Visit: Payer: Self-pay | Admitting: Nurse Practitioner

## 2022-10-31 DIAGNOSIS — F32A Depression, unspecified: Secondary | ICD-10-CM

## 2022-11-07 ENCOUNTER — Ambulatory Visit (INDEPENDENT_AMBULATORY_CARE_PROVIDER_SITE_OTHER): Payer: 59 | Admitting: Nurse Practitioner

## 2022-11-07 VITALS — BP 122/84 | HR 77 | Temp 98.0°F | Ht 62.0 in | Wt 213.2 lb

## 2022-11-07 DIAGNOSIS — Z6839 Body mass index (BMI) 39.0-39.9, adult: Secondary | ICD-10-CM

## 2022-11-07 DIAGNOSIS — E785 Hyperlipidemia, unspecified: Secondary | ICD-10-CM

## 2022-11-07 DIAGNOSIS — K219 Gastro-esophageal reflux disease without esophagitis: Secondary | ICD-10-CM

## 2022-11-07 DIAGNOSIS — Z1211 Encounter for screening for malignant neoplasm of colon: Secondary | ICD-10-CM | POA: Insufficient documentation

## 2022-11-07 DIAGNOSIS — Z1159 Encounter for screening for other viral diseases: Secondary | ICD-10-CM | POA: Insufficient documentation

## 2022-11-07 DIAGNOSIS — E039 Hypothyroidism, unspecified: Secondary | ICD-10-CM | POA: Diagnosis not present

## 2022-11-07 DIAGNOSIS — Z0001 Encounter for general adult medical examination with abnormal findings: Secondary | ICD-10-CM

## 2022-11-07 DIAGNOSIS — I10 Essential (primary) hypertension: Secondary | ICD-10-CM

## 2022-11-07 DIAGNOSIS — Z131 Encounter for screening for diabetes mellitus: Secondary | ICD-10-CM | POA: Diagnosis not present

## 2022-11-07 DIAGNOSIS — G8929 Other chronic pain: Secondary | ICD-10-CM

## 2022-11-07 DIAGNOSIS — Z113 Encounter for screening for infections with a predominantly sexual mode of transmission: Secondary | ICD-10-CM

## 2022-11-07 DIAGNOSIS — M549 Dorsalgia, unspecified: Secondary | ICD-10-CM

## 2022-11-07 DIAGNOSIS — E669 Obesity, unspecified: Secondary | ICD-10-CM | POA: Insufficient documentation

## 2022-11-07 LAB — URINALYSIS WITH CULTURE, IF INDICATED
Bilirubin Urine: NEGATIVE
Ketones, ur: NEGATIVE
Leukocytes,Ua: NEGATIVE
Nitrite: NEGATIVE
Specific Gravity, Urine: <=1.005 — AB (ref 1.000–1.030)
Total Protein, Urine: NEGATIVE
Urine Glucose: NEGATIVE
Urobilinogen, UA: 0.2 (ref 0.0–1.0)
pH: 6 (ref 5.0–8.0)

## 2022-11-07 MED ORDER — FAMOTIDINE 20 MG PO TABS: 20.0000 mg | ORAL_TABLET | Freq: Two times a day (BID) | ORAL | 1 refills | Status: DC

## 2022-11-07 NOTE — Assessment & Plan Note (Signed)
Chronic Check TSH Dose NP thyroid accordingly

## 2022-11-07 NOTE — Assessment & Plan Note (Addendum)
Chronic, suboptimally controlled EKG within normal limits today Chest pain nonexertional, appears more consistent with GERD.  Patient was told if she would like evaluation with cardiology for further recommendations I can set this up but she declined for now.  She was encouraged to let me know if chest pain occurs with physical exertion, or starts to be associated with nausea, sweating, fatigue, etc. she reports understanding. Per shared decision making patient will continue on famotidine 20 mg twice a day

## 2022-11-07 NOTE — Assessment & Plan Note (Signed)
Labs ordered, further recommendations may be made based upon his results. 

## 2022-11-07 NOTE — Assessment & Plan Note (Signed)
Referral to GI made today.  

## 2022-11-07 NOTE — Assessment & Plan Note (Signed)
Chronic May be related to her history of breast augmentation Referral to plastic surgery for consultation regarding removal of breast implants made today.

## 2022-11-07 NOTE — Progress Notes (Addendum)
Established Patient Office Visit  Subjective   Patient ID: Alyssa Vincent, female    DOB: Dec 14, 1970  Age: 52 y.o. MRN: [id number]  Chief Complaint  Patient presents with   Annual Exam    Health maintenance: STI screening: Due, hep C screening: Due, Tdap: Due, colonoscopy: Due, mammogram: 12/11/2021, Pap smear 10/15/2021  Also has additional concerns she like to discuss today as detailed below  1.  History of breast augmentation: She is experiencing upper back pain, she is concerned this may be related to her current breast implants.  Would like consultation regarding removal. 2.  Left ankle swelling: Has been going on for years.  Intermittent.  Improves with elevation most of the time.  No swelling or pain noted. 3.  Is interested to see what her cortisol levels are as she is wondering if this could be making it difficult for her to lose weight. 4.  GERD/chest pain: Intermittent chest pain, not exertional.  Also has frequent heartburn.    Review of Systems  Constitutional:  Negative for chills, fever, malaise/fatigue and weight loss.  Eyes:  Negative for blurred vision and double vision.  Respiratory:  Positive for cough (morning only). Negative for shortness of breath and wheezing.   Cardiovascular:  Positive for palpitations (intermittent, stable). Negative for chest pain.  Gastrointestinal:  Positive for heartburn and nausea. Negative for abdominal pain, blood in stool and vomiting.  Genitourinary:  Negative for hematuria.  Neurological:  Negative for seizures and loss of consciousness.  Psychiatric/Behavioral:  Negative for depression and suicidal ideas. The patient is not nervous/anxious and does not have insomnia.       Objective:     BP 122/84   Pulse 77   Temp 98 F (36.7 C) (Temporal)   Ht 5\' 2"  (1.575 m)   Wt 213 lb 4 oz (96.7 kg)   SpO2 97%   BMI 39.00 kg/m  BP Readings from Last 3 Encounters:  11/07/22 122/84  07/29/22 131/86  05/09/22 134/82   Wt Readings  from Last 3 Encounters:  11/07/22 213 lb 4 oz (96.7 kg)  07/29/22 211 lb (95.7 kg)  05/09/22 207 lb 8 oz (94.1 kg)        11/07/2022    1:03 PM 05/09/2022    1:59 PM 10/15/2021    9:33 AM  PHQ9 SCORE ONLY  PHQ-9 Total Score 0 3 4     Physical Exam Vitals reviewed.  Constitutional:      Appearance: Normal appearance.  HENT:     Head: Normocephalic and atraumatic.     Right Ear: Tympanic membrane, ear canal and external ear normal.     Left Ear: Tympanic membrane, ear canal and external ear normal.  Eyes:     General:        Right eye: No discharge.        Left eye: No discharge.     Extraocular Movements: Extraocular movements intact.     Conjunctiva/sclera: Conjunctivae normal.     Pupils: Pupils are equal, round, and reactive to light.  Neck:     Vascular: No carotid bruit.  Cardiovascular:     Rate and Rhythm: Normal rate and regular rhythm.     Pulses: Normal pulses.     Heart sounds: Normal heart sounds. No murmur heard. Pulmonary:     Effort: Pulmonary effort is normal.     Breath sounds: Normal breath sounds.  Chest:  Breasts:    Breasts are symmetrical.     Right:  Normal.     Left: Normal.  Abdominal:     General: Abdomen is flat. Bowel sounds are normal. There is no distension.     Palpations: Abdomen is soft. There is no mass.     Tenderness: There is no abdominal tenderness.  Musculoskeletal:        General: No tenderness.     Cervical back: Neck supple. No muscular tenderness.     Right lower leg: No edema.     Left lower leg: No edema.  Lymphadenopathy:     Cervical: No cervical adenopathy.     Upper Body:     Right upper body: No supraclavicular adenopathy.     Left upper body: No supraclavicular adenopathy.  Skin:    General: Skin is warm and dry.  Neurological:     General: No focal deficit present.     Mental Status: She is alert and oriented to person, place, and time.     Motor: No weakness.     Gait: Gait normal.  Psychiatric:         Mood and Affect: Mood normal.        Behavior: Behavior normal.        Judgment: Judgment normal.      Results for orders placed or performed in visit on 11/07/22  Urinalysis with Culture, if indicated   Specimen: Urine  Result Value Ref Range   Color, Urine YELLOW Yellow;Lt. Yellow;Straw;Dark Yellow;Amber;Green;Red;Brown   APPearance CLEAR Clear;Turbid;Slightly Cloudy;Cloudy   Specific Gravity, Urine <=1.005 (A) 1.000 - 1.030   pH 6.0 5.0 - 8.0   Total Protein, Urine NEGATIVE Negative   Urine Glucose NEGATIVE Negative   Ketones, ur NEGATIVE Negative   Bilirubin Urine NEGATIVE Negative   Hgb urine dipstick TRACE-INTACT (A) Negative   Urobilinogen, UA 0.2 0.0 - 1.0   Leukocytes,Ua NEGATIVE Negative   Nitrite NEGATIVE Negative   WBC, UA 0-2/hpf 0-2/hpf   RBC / HPF 0-2/hpf 0-2/hpf      The 10-year ASCVD risk score (Arnett DK, et al., 2019) is: 1.8%    Assessment & Plan:   Problem List Items Addressed This Visit       Cardiovascular and Mediastinum   Essential hypertension, benign    Chronic, controlled Continue losartan 50 mg daily      Relevant Orders   Chlamydia/Neisseria Gonorrhoeae RNA,TMA,Urogenital   CBC   Comprehensive metabolic panel   Hemoglobin A1c   Hepatitis C antibody   HIV Antibody (routine testing w rflx)   Lipid panel   RPR   TSH     Digestive   Gastroesophageal reflux disease    Chronic, suboptimally controlled EKG within normal limits today Chest pain nonexertional, appears more consistent with GERD.  Patient was told if she would like evaluation with cardiology for further recommendations I can set this up but she declined for now.  She was encouraged to let me know if chest pain occurs with physical exertion, or starts to be associated with nausea, sweating, fatigue, etc. she reports understanding. Per shared decision making patient will continue on famotidine 20 mg twice a day      Relevant Medications   famotidine (PEPCID) 20 MG tablet      Endocrine   Hypothyroidism (acquired)    Chronic Check TSH Dose NP thyroid accordingly      Relevant Orders   Chlamydia/Neisseria Gonorrhoeae RNA,TMA,Urogenital   CBC   Comprehensive metabolic panel   Hemoglobin A1c   Hepatitis C antibody   HIV Antibody (  routine testing w rflx)   Lipid panel   RPR   TSH     Other   Encounter for general adult medical examination with abnormal findings - Primary    Labs ordered, further recommendations may be made based upon his results Patient courage to follow healthy lifestyle, handout provided      Relevant Orders   Urinalysis with Culture, if indicated (Completed)   EKG 12-Lead   HLD (hyperlipidemia)    Labs ordered, further recommendations may be made based upon his results       Relevant Orders   Chlamydia/Neisseria Gonorrhoeae RNA,TMA,Urogenital   EKG 12-Lead   CBC   Comprehensive metabolic panel   Hemoglobin A1c   Hepatitis C antibody   HIV Antibody (routine testing w rflx)   Lipid panel   RPR   TSH   Diabetes mellitus screening    Labs ordered, further recommendations may be made based upon his results       Relevant Orders   Chlamydia/Neisseria Gonorrhoeae RNA,TMA,Urogenital   CBC   Comprehensive metabolic panel   Hemoglobin A1c   Hepatitis C antibody   HIV Antibody (routine testing w rflx)   Lipid panel   RPR   TSH   Chronic bilateral back pain    Chronic May be related to her history of breast augmentation Referral to plastic surgery for consultation regarding removal of breast implants made today.      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       Relevant Orders   Chlamydia/Neisseria Gonorrhoeae RNA,TMA,Urogenital   CBC   Comprehensive metabolic panel   Cortisol   Hemoglobin A1c   Hepatitis C antibody   HIV Antibody (routine testing w rflx)   Lipid panel   RPR   TSH   Colon cancer screening    Referral to GI made today      Relevant Orders   Ambulatory referral to Gastroenterology   Chlamydia/Neisseria Gonorrhoeae RNA,TMA,Urogenital   Hepatitis C antibody   HIV Antibody (routine testing w rflx)   RPR   Encounter for hepatitis C screening test for low risk patient    Labs ordered, further recommendations may be made based upon his results       Relevant Orders   Chlamydia/Neisseria Gonorrhoeae RNA,TMA,Urogenital   Hepatitis C antibody   HIV Antibody (routine testing w rflx)   RPR   Routine screening for STI (sexually transmitted infection)    Labs ordered, further recommendations may be made based upon his results       Relevant Orders   Chlamydia/Neisseria Gonorrhoeae RNA,TMA,Urogenital   Hepatitis C antibody   HIV Antibody (routine testing w rflx)   RPR    Return in about 6 months (around 05/10/2023) for F/U with Maralyn Sago.  In addition to performing annual physical exam also performed an office visit as detailed above   Elenore Paddy, NP

## 2022-11-07 NOTE — Assessment & Plan Note (Signed)
Chronic, controlled  Continue losartan 50mg daily

## 2022-11-07 NOTE — Assessment & Plan Note (Signed)
Labs ordered, further recommendations may be made based upon his results Patient courage to follow healthy lifestyle, handout provided

## 2022-11-07 NOTE — Assessment & Plan Note (Signed)
Chronic Labs ordered for further evaluation, further recommendations may be made based upon the results.

## 2022-11-10 LAB — CHLAMYDIA/NEISSERIA GONORRHOEAE RNA,TMA,UROGENTIAL
C. trachomatis RNA, TMA: NOT DETECTED
N. gonorrhoeae RNA, TMA: NOT DETECTED

## 2022-11-14 ENCOUNTER — Other Ambulatory Visit: Payer: Self-pay | Admitting: Nurse Practitioner

## 2022-11-26 ENCOUNTER — Other Ambulatory Visit: Payer: Self-pay | Admitting: Nurse Practitioner

## 2022-11-26 DIAGNOSIS — F32A Depression, unspecified: Secondary | ICD-10-CM

## 2022-12-15 ENCOUNTER — Other Ambulatory Visit: Payer: Self-pay | Admitting: Nurse Practitioner

## 2022-12-15 DIAGNOSIS — I10 Essential (primary) hypertension: Secondary | ICD-10-CM

## 2023-01-13 ENCOUNTER — Other Ambulatory Visit: Payer: Self-pay | Admitting: Nurse Practitioner

## 2023-01-17 LAB — HEPATITIS C ANTIBODY: Hepatitis C Ab: NONREACTIVE

## 2023-01-17 LAB — COMPREHENSIVE METABOLIC PANEL
AG Ratio: 1.7 (calc) (ref 1.0–2.5)
ALT: 13 U/L (ref 6–29)
AST: 9 U/L — ABNORMAL LOW (ref 10–35)
Albumin: 4 g/dL (ref 3.6–5.1)
Alkaline phosphatase (APISO): 71 U/L (ref 37–153)
BUN: 14 mg/dL (ref 7–25)
CO2: 24 mmol/L (ref 20–32)
Calcium: 8.9 mg/dL (ref 8.6–10.4)
Chloride: 105 mmol/L (ref 98–110)
Creat: 0.62 mg/dL (ref 0.50–1.03)
Globulin: 2.3 g/dL (calc) (ref 1.9–3.7)
Glucose, Bld: 93 mg/dL (ref 65–99)
Potassium: 4.3 mmol/L (ref 3.5–5.3)
Sodium: 139 mmol/L (ref 135–146)
Total Bilirubin: 0.3 mg/dL (ref 0.2–1.2)
Total Protein: 6.3 g/dL (ref 6.1–8.1)

## 2023-01-17 LAB — HEMOGLOBIN A1C
Hgb A1c MFr Bld: 5.8 % of total Hgb — ABNORMAL HIGH (ref <5.7)
Mean Plasma Glucose: 120 mg/dL
eAG (mmol/L): 6.6 mmol/L

## 2023-01-17 LAB — CBC
HCT: 39.1 % (ref 35.0–45.0)
Hemoglobin: 13 g/dL (ref 11.7–15.5)
MCH: 29 pg (ref 27.0–33.0)
MCHC: 33.2 g/dL (ref 32.0–36.0)
MCV: 87.3 fL (ref 80.0–100.0)
MPV: 10.5 fL (ref 7.5–12.5)
Platelets: 300 10*3/uL (ref 140–400)
RBC: 4.48 10*6/uL (ref 3.80–5.10)
RDW: 13.4 % (ref 11.0–15.0)
WBC: 7.8 10*3/uL (ref 3.8–10.8)

## 2023-01-17 LAB — LIPID PANEL
Cholesterol: 196 mg/dL (ref <200)
HDL: 55 mg/dL (ref >=50)
LDL Cholesterol (Calc): 121 mg/dL (calc) — ABNORMAL HIGH
Non-HDL Cholesterol (Calc): 141 mg/dL (calc) — ABNORMAL HIGH (ref <130)
Total CHOL/HDL Ratio: 3.6 (calc) (ref <5.0)
Triglycerides: 94 mg/dL (ref <150)

## 2023-01-17 LAB — CORTISOL: Cortisol, Plasma: 13.3 ug/dL

## 2023-01-17 LAB — CHLAMYDIA/NEISSERIA GONORRHOEAE RNA,TMA,UROGENTIAL
C. trachomatis RNA, TMA: NOT DETECTED
N. gonorrhoeae RNA, TMA: NOT DETECTED

## 2023-01-17 LAB — HIV ANTIBODY (ROUTINE TESTING W REFLEX): HIV 1&2 Ab, 4th Generation: NONREACTIVE

## 2023-01-17 LAB — RPR: RPR Ser Ql: NONREACTIVE

## 2023-01-17 LAB — TSH: TSH: 1.45 mIU/L

## 2023-05-13 ENCOUNTER — Other Ambulatory Visit: Payer: Self-pay | Admitting: Nurse Practitioner

## 2023-05-13 DIAGNOSIS — K219 Gastro-esophageal reflux disease without esophagitis: Secondary | ICD-10-CM

## 2023-05-23 ENCOUNTER — Other Ambulatory Visit: Payer: Self-pay | Admitting: Nurse Practitioner

## 2023-05-23 DIAGNOSIS — F32A Depression, unspecified: Secondary | ICD-10-CM

## 2023-06-13 ENCOUNTER — Other Ambulatory Visit: Payer: Self-pay | Admitting: Nurse Practitioner

## 2023-06-13 DIAGNOSIS — E039 Hypothyroidism, unspecified: Secondary | ICD-10-CM

## 2023-09-02 ENCOUNTER — Other Ambulatory Visit: Payer: Self-pay | Admitting: Nurse Practitioner

## 2023-09-02 DIAGNOSIS — I10 Essential (primary) hypertension: Secondary | ICD-10-CM

## 2023-09-16 ENCOUNTER — Encounter: Payer: Self-pay | Admitting: Nurse Practitioner

## 2023-09-16 ENCOUNTER — Other Ambulatory Visit: Payer: Self-pay

## 2023-09-16 DIAGNOSIS — E039 Hypothyroidism, unspecified: Secondary | ICD-10-CM

## 2023-09-16 MED ORDER — NP THYROID 60 MG PO TABS
60.0000 mg | ORAL_TABLET | Freq: Every day | ORAL | 2 refills | Status: DC
Start: 2023-09-16 — End: 2023-09-17

## 2023-09-17 ENCOUNTER — Other Ambulatory Visit: Payer: Self-pay

## 2023-09-17 DIAGNOSIS — E039 Hypothyroidism, unspecified: Secondary | ICD-10-CM

## 2023-09-17 MED ORDER — NP THYROID 60 MG PO TABS: 60.0000 mg | ORAL_TABLET | Freq: Every day | ORAL | 2 refills | Status: DC

## 2024-02-21 ENCOUNTER — Other Ambulatory Visit: Payer: Self-pay | Admitting: Nurse Practitioner

## 2024-02-21 DIAGNOSIS — F32A Depression, unspecified: Secondary | ICD-10-CM

## 2024-03-10 ENCOUNTER — Other Ambulatory Visit: Payer: Self-pay | Admitting: Nurse Practitioner

## 2024-03-10 DIAGNOSIS — I10 Essential (primary) hypertension: Secondary | ICD-10-CM

## 2024-04-09 ENCOUNTER — Other Ambulatory Visit: Payer: Self-pay | Admitting: Nurse Practitioner

## 2024-04-09 DIAGNOSIS — I10 Essential (primary) hypertension: Secondary | ICD-10-CM

## 2024-04-13 ENCOUNTER — Other Ambulatory Visit: Payer: Self-pay | Admitting: Nurse Practitioner

## 2024-04-13 DIAGNOSIS — I10 Essential (primary) hypertension: Secondary | ICD-10-CM

## 2024-04-28 DIAGNOSIS — R7303 Prediabetes: Secondary | ICD-10-CM | POA: Insufficient documentation

## 2024-04-28 NOTE — Progress Notes (Unsigned)
      Subjective:    Patient ID: Alyssa Vincent, female    DOB: 1970-12-14, 53 y.o.   MRN: [id number]     HPI Reham is here for follow up of her chronic medical problems.  Here for follow-up for refills on medication.  She was not able to get in with her PCP until January.  Medications and allergies reviewed with patient and updated if appropriate.  Current Outpatient Medications on File Prior to Visit  Medication Sig Dispense Refill   ALPRAZolam  (XANAX ) 0.25 MG tablet Take 1 tablet (0.25 mg total) by mouth daily as needed for anxiety. 30 tablet 0   cyclobenzaprine  (FLEXERIL ) 10 MG tablet Take 1 tablet (10 mg total) by mouth as needed for muscle spasms. 30 tablet 2   famotidine  (PEPCID ) 20 MG tablet TAKE 1 TABLET BY MOUTH TWICE A DAY 180 tablet 1   fexofenadine  (ALLEGRA ) 180 MG tablet TAKE 1 TABLET BY MOUTH EVERY DAY 90 tablet 1   levonorgestrel  (MIRENA ) 20 MCG/DAY IUD 1 each by Intrauterine route once.     losartan  (COZAAR ) 50 MG tablet TAKE 1 TABLET BY MOUTH EVERY DAY 30 tablet 0   montelukast  (SINGULAIR ) 10 MG tablet TAKE 1 TABLET BY MOUTH EVERYDAY AT BEDTIME 90 tablet 2   NP THYROID  60 MG tablet Take 1 tablet (60 mg total) by mouth daily before breakfast. 90 tablet 2   sertraline  (ZOLOFT ) 50 MG tablet TAKE 1 TABLET BY MOUTH EVERY DAY 90 tablet 2   No current facility-administered medications on file prior to visit.     Review of Systems     Objective:  There were no vitals filed for this visit. BP Readings from Last 3 Encounters:  11/07/22 122/84  07/29/22 131/86  05/09/22 134/82   Wt Readings from Last 3 Encounters:  11/07/22 213 lb 4 oz (96.7 kg)  07/29/22 211 lb (95.7 kg)  05/09/22 207 lb 8 oz (94.1 kg)   There is no height or weight on file to calculate BMI.    Physical Exam     Lab Results  Component Value Date   WBC 7.8 01/13/2023   HGB 13.0 01/13/2023   HCT 39.1 01/13/2023   PLT 300 01/13/2023   GLUCOSE 93 01/13/2023   CHOL 196 01/13/2023   TRIG 94  01/13/2023   HDL 55 01/13/2023   LDLCALC 121 (H) 01/13/2023   ALT 13 01/13/2023   AST 9 (L) 01/13/2023   NA 139 01/13/2023   K 4.3 01/13/2023   CL 105 01/13/2023   CREATININE 0.62 01/13/2023   BUN 14 01/13/2023   CO2 24 01/13/2023   TSH 1.45 01/13/2023   HGBA1C 5.8 (H) 01/13/2023     Assessment & Plan:    See Problem List for Assessment and Plan of chronic medical problems.

## 2024-04-28 NOTE — Patient Instructions (Addendum)
     Blood work was ordered.      Medications changes include :   zpak     Return in about 6 months (around 10/27/2024) for Physical Exam with PCP.

## 2024-04-29 ENCOUNTER — Ambulatory Visit: Admitting: Internal Medicine

## 2024-04-29 ENCOUNTER — Encounter: Payer: Self-pay | Admitting: Internal Medicine

## 2024-04-29 VITALS — BP 130/76 | HR 72 | Temp 98.1°F | Ht 62.0 in | Wt 212.0 lb

## 2024-04-29 DIAGNOSIS — E039 Hypothyroidism, unspecified: Secondary | ICD-10-CM | POA: Diagnosis not present

## 2024-04-29 DIAGNOSIS — K219 Gastro-esophageal reflux disease without esophagitis: Secondary | ICD-10-CM

## 2024-04-29 DIAGNOSIS — F32A Depression, unspecified: Secondary | ICD-10-CM

## 2024-04-29 DIAGNOSIS — I10 Essential (primary) hypertension: Secondary | ICD-10-CM | POA: Diagnosis not present

## 2024-04-29 DIAGNOSIS — R7303 Prediabetes: Secondary | ICD-10-CM

## 2024-04-29 DIAGNOSIS — J22 Unspecified acute lower respiratory infection: Secondary | ICD-10-CM

## 2024-04-29 DIAGNOSIS — E785 Hyperlipidemia, unspecified: Secondary | ICD-10-CM

## 2024-04-29 MED ORDER — AZITHROMYCIN 250 MG PO TABS: ORAL_TABLET | ORAL | 0 refills | Status: DC

## 2024-04-29 MED ORDER — LOSARTAN POTASSIUM 50 MG PO TABS: 50.0000 mg | ORAL_TABLET | Freq: Every day | ORAL | 1 refills | Status: AC

## 2024-04-29 NOTE — Assessment & Plan Note (Addendum)
 Acute Concern for bacterial infection Start Z-Pak otc cold medications, allergy medications Rest, fluid Call if no improvement

## 2024-04-29 NOTE — Assessment & Plan Note (Addendum)
 Chronic BP well controlled Continue losartan  50 mg daily CMP

## 2024-04-29 NOTE — Assessment & Plan Note (Deleted)
 Chronic Check lipid panel  Continue lifestyle control Regular exercise and healthy diet encouraged

## 2024-04-29 NOTE — Assessment & Plan Note (Signed)
Chronic GERD controlled Continue pepcid 20 mg bid  

## 2024-04-29 NOTE — Assessment & Plan Note (Addendum)
 Chronic  Clinically euthyroid Check tsh and will titrate med dose if needed Continue NP thyroid  60 mg daily

## 2024-04-29 NOTE — Assessment & Plan Note (Signed)
 Chronic Lab Results  Component Value Date   HGBA1C 5.8 (H) 01/13/2023   Check a1c Low sugar / carb diet Stressed regular exercise

## 2024-04-29 NOTE — Assessment & Plan Note (Signed)
 Chronic Controlled, Stable Continue sertraline 50 mg daily

## 2024-07-19 ENCOUNTER — Other Ambulatory Visit: Payer: Self-pay | Admitting: Nurse Practitioner

## 2024-07-19 DIAGNOSIS — E039 Hypothyroidism, unspecified: Secondary | ICD-10-CM

## 2024-07-22 ENCOUNTER — Encounter

## 2024-07-22 ENCOUNTER — Encounter: Payer: Self-pay | Admitting: Family Medicine

## 2024-07-22 ENCOUNTER — Ambulatory Visit: Admitting: Family Medicine

## 2024-07-22 VITALS — BP 118/70 | HR 77 | Temp 98.2°F | Ht 62.0 in | Wt 205.0 lb

## 2024-07-22 DIAGNOSIS — J329 Chronic sinusitis, unspecified: Secondary | ICD-10-CM

## 2024-07-22 DIAGNOSIS — I10 Essential (primary) hypertension: Secondary | ICD-10-CM | POA: Diagnosis not present

## 2024-07-22 DIAGNOSIS — B9689 Other specified bacterial agents as the cause of diseases classified elsewhere: Secondary | ICD-10-CM | POA: Diagnosis not present

## 2024-07-22 DIAGNOSIS — R7303 Prediabetes: Secondary | ICD-10-CM

## 2024-07-22 DIAGNOSIS — R051 Acute cough: Secondary | ICD-10-CM | POA: Diagnosis not present

## 2024-07-22 DIAGNOSIS — E039 Hypothyroidism, unspecified: Secondary | ICD-10-CM | POA: Diagnosis not present

## 2024-07-22 LAB — POCT INFLUENZA A/B
Influenza A, POC: NEGATIVE
Influenza B, POC: NEGATIVE

## 2024-07-22 LAB — POC COVID19 BINAXNOW: SARS Coronavirus 2 Ag: NEGATIVE

## 2024-07-22 MED ORDER — AZITHROMYCIN 250 MG PO TABS: ORAL_TABLET | ORAL | 0 refills | Status: AC

## 2024-07-22 NOTE — Patient Instructions (Signed)
 I have sent in azithromycin  for you to take.  Take 2 tablets today, then 1 tablet daily for the next 4 days.  I have printed your lab orders for you today.   Follow-up with me for new or worsening symptoms.

## 2024-07-22 NOTE — Progress Notes (Signed)
 "  Acute Office Visit  Subjective:     Patient ID: Alyssa Vincent, female    DOB: 10-13-1970, 54 y.o.   MRN: [id number]  Chief Complaint  Patient presents with   Sore Throat    Flu symptoms started Monday dry cough congestion     HPI  Discussed the use of AI scribe software for clinical note transcription with the patient, who gave verbal consent to proceed.  History of Present Illness Alyssa Vincent is a 54 year old female who presents with sore throat and ear discomfort.  Pharyngalgia and otalgia - Sore throat for approximately one week - Pain initially localized to the throat, now radiating to both ears - No fever, but subjective sensation of feeling warm  Gastrointestinal symptoms - Diarrhea and upset stomach for the past week - No use of medications for gastrointestinal symptoms - Maintaining hydration with water  Medication and allergy considerations - Hypertension limits use of oral decongestants such as Sudafed - Allergic to erythromycin, penicillin, and codeine (codeine causes itching) - Previously tolerated azithromycin  despite gastrointestinal upset - Biaxin found to be ineffective and poorly tolerated  Infectious exposure - Works at [employer] - Exposure to a sick coworker who stayed home shortly before symptom onset     ROS Per HPI      Objective:    BP 118/70   Pulse 77   Temp 98.2 F (36.8 C) (Temporal)   Ht 5' 2 (1.575 m)   Wt 205 lb (93 kg)   SpO2 95%   BMI 37.49 kg/m    Physical Exam Vitals and nursing note reviewed.  Constitutional:      General: She is not in acute distress.    Appearance: Normal appearance. She is ill-appearing.  HENT:     Head: Normocephalic and atraumatic.     Right Ear: External ear normal.     Left Ear: External ear normal.     Nose: Congestion present.     Right Sinus: Maxillary sinus tenderness and frontal sinus tenderness present.     Left Sinus: Maxillary sinus tenderness and frontal sinus tenderness  present.     Mouth/Throat:     Mouth: Mucous membranes are moist.     Comments: Oropharyngeal cobblestoning   Eyes:     Extraocular Movements: Extraocular movements intact.     Pupils: Pupils are equal, round, and reactive to light.  Cardiovascular:     Rate and Rhythm: Normal rate and regular rhythm.     Pulses: Normal pulses.     Heart sounds: Normal heart sounds.  Pulmonary:     Effort: Pulmonary effort is normal. No respiratory distress.     Breath sounds: Normal breath sounds. No wheezing, rhonchi or rales.  Musculoskeletal:        General: Normal range of motion.     Cervical back: Normal range of motion.     Right lower leg: No edema.     Left lower leg: No edema.  Lymphadenopathy:     Cervical: Cervical adenopathy present.  Neurological:     General: No focal deficit present.     Mental Status: She is alert and oriented to person, place, and time.  Psychiatric:        Mood and Affect: Mood normal.        Thought Content: Thought content normal.     Results for orders placed or performed in visit on 07/22/24  POC COVID-19 BinaxNow  Result Value Ref Range   SARS Coronavirus  2 Ag Negative Negative        Assessment & Plan:   Assessment and Plan Assessment & Plan Acute bacterial sinusitis Suspected due to throat and ear discomfort, sinus tenderness, and persistent symptoms. Negative for COVID and flu. No fever. Previous adverse reactions to Biaxin and codeine noted. - Prescribed Z-Pak (azithromycin ) to be picked up at [facility] at [facility].  Essential hypertension Advised against using over-the-counter Sudafed due to potential increase in blood pressure.  Printed labs to take to [facility]     Orders Placed This Encounter  Procedures   TSH    Standing Status:   Future    Expiration Date:   07/22/2025   HgB A1c    Standing Status:   Future    Expiration Date:   07/22/2025   Comp Met (CMET)    Standing Status:   Future    Expiration Date:   07/22/2025   POC  COVID-19 BinaxNow   POCT Influenza A/B     Meds ordered this encounter  Medications   azithromycin  (ZITHROMAX ) 250 MG tablet    Sig: Take 2 tablets on day 1, then 1 tablet daily on days 2 through 5    Dispense:  6 tablet    Refill:  0    Return if symptoms worsen or fail to improve.  Corean LITTIE Ku, FNP  "

## 2024-08-04 ENCOUNTER — Other Ambulatory Visit: Payer: Self-pay | Admitting: Nurse Practitioner

## 2024-10-28 ENCOUNTER — Encounter: Admitting: Nurse Practitioner
